# Patient Record
Sex: Male | Born: 1985 | Race: White | Hispanic: No | Marital: Single | Smoking: Never smoker
Health system: Southern US, Community
[De-identification: ages and names within clinical notes are randomized; demographics above are authoritative.]

## PROBLEM LIST (undated history)

## (undated) DIAGNOSIS — E782 Mixed hyperlipidemia: Secondary | ICD-10-CM

## (undated) DIAGNOSIS — L409 Psoriasis, unspecified: Secondary | ICD-10-CM

## (undated) DIAGNOSIS — M1A9XX0 Chronic gout, unspecified, without tophus (tophi): Secondary | ICD-10-CM

## (undated) DIAGNOSIS — A630 Anogenital (venereal) warts: Secondary | ICD-10-CM

## (undated) HISTORY — DX: Psoriasis, unspecified: L40.9

## (undated) HISTORY — DX: Mixed hyperlipidemia: E78.2

## (undated) HISTORY — DX: Morbid (severe) obesity due to excess calories: E66.01

## (undated) HISTORY — DX: Anogenital (venereal) warts: A63.0

## (undated) HISTORY — DX: Chronic gout, unspecified, without tophus (tophi): M1A.9XX0

---

## 2009-04-05 ENCOUNTER — Ambulatory Visit (HOSPITAL_COMMUNITY): Payer: Self-pay | Admitting: Psychiatry

## 2009-08-03 ENCOUNTER — Ambulatory Visit (HOSPITAL_COMMUNITY): Payer: Self-pay | Admitting: Psychiatry

## 2010-09-16 ENCOUNTER — Ambulatory Visit: Payer: Self-pay | Admitting: Emergency Medicine

## 2010-12-25 NOTE — Assessment & Plan Note (Signed)
Summary: L middle finger pain x 1 dy rm 4   Vital Signs:  Patient Profile:   25 Years Old Male CC:      L middle finger  pain x today Height:     69 inches Weight:      205 pounds O2 Sat:      100 % O2 treatment:    Room Air Temp:     98.0 degrees F oral Pulse rate:   77 / minute Pulse rhythm:   regular Resp:     16 per minute BP sitting:   116 / 78  (left arm) Cuff size:   regular  Vitals Entered By: Areta Haber CMA (September 16, 2010 3:37 PM)                  Current Allergies: No known allergies History of Present Illness Chief Complaint: L middle finger  pain x today History of Present Illness: Playing football yesterday, catching a football, jammed his L middle finger.  Has had pain and swelling since with stiffness.  Not using any OTC meds.  Hurts to move it.  Pain is fairly constant.  Current Problems: FINGER PAIN (ICD-729.5)   REVIEW OF SYSTEMS Constitutional Symptoms      Denies fever, chills, night sweats, weight loss, weight gain, and fatigue.  Eyes       Denies change in vision, eye pain, eye discharge, glasses, contact lenses, and eye surgery. Ear/Nose/Throat/Mouth       Denies hearing loss/aids, change in hearing, ear pain, ear discharge, dizziness, frequent runny nose, frequent nose bleeds, sinus problems, sore throat, hoarseness, and tooth pain or bleeding.  Respiratory       Denies dry cough, productive cough, wheezing, shortness of breath, asthma, bronchitis, and emphysema/COPD.  Cardiovascular       Denies murmurs, chest pain, and tires easily with exhertion.    Gastrointestinal       Denies stomach pain, nausea/vomiting, diarrhea, constipation, blood in bowel movements, and indigestion. Genitourniary       Denies painful urination, kidney stones, and loss of urinary control. Neurological       Denies paralysis, seizures, and fainting/blackouts. Musculoskeletal       Complains of muscle pain, decreased range of motion, redness, and  swelling.      Denies joint pain, joint stiffness, muscle weakness, and gout.      Comments: L middle finger x 1 dy Skin       Denies bruising, unusual mles/lumps or sores, and hair/skin or nail changes.  Psych       Denies mood changes, temper/anger issues, anxiety/stress, speech problems, depression, and sleep problems. Other Comments: Pt states he was playing football and jammed L middle finger yesterday 09/15/10.   Past History:  Past Medical History: Unremarkable  Past Surgical History: Denies surgical history  Family History: Family History Hypertension Family History Other cancer  Social History: Single Never Smoked Alcohol use-yes 1-2 wkly Drug use-no Regular exercise-yes Smoking Status:  never Drug Use:  no Does Patient Exercise:  yes Physical Exam General appearance: well developed, well nourished, no acute distress Head: normocephalic, atraumatic Skin: no obvious rashes or lesions MSE: oriented to time, place, and person L middle finger: No TTP at MCP or DIP, Pain is at PIP.  No collateral ligament laxity.  PIP ROM is slightly limited on flexion by swelling.  DIP and MCP ROM and strength is normal.  +swelling at PIP and throughtout most of finger, no bruising.  Normal  strength and sensation.  Intact radial pulse and normal cap refill. Assessment New Problems: FINGER PAIN (ICD-729.5)  Finger sprain Xray is normal  Plan New Orders: New Patient Level III [99203] T-DG Finger Middle*L* [73140] Planning Comments:   Ice, elevate, Buddy tape, Ibuprofen as needed Buddy tape for a few days After swelling subsides, start ROM exercises If still painful in a week, set up with sports medicine   The patient and/or caregiver has been counseled thoroughly with regard to medications prescribed including dosage, schedule, interactions, rationale for use, and possible side effects and they verbalize understanding.  Diagnoses and expected course of recovery discussed and  will return if not improved as expected or if the condition worsens. Patient and/or caregiver verbalized understanding.   Orders Added: 1)  New Patient Level III [99203] 2)  T-DG Finger Middle*L* [73140]

## 2011-02-19 ENCOUNTER — Inpatient Hospital Stay (INDEPENDENT_AMBULATORY_CARE_PROVIDER_SITE_OTHER)
Admission: RE | Admit: 2011-02-19 | Discharge: 2011-02-19 | Disposition: A | Discharge status: Home / Self Care | Payer: 59 | Source: Ambulatory Visit | Attending: Family Medicine | Admitting: Family Medicine

## 2011-02-19 ENCOUNTER — Encounter: Payer: Self-pay | Admitting: Family Medicine

## 2011-02-21 ENCOUNTER — Telehealth (INDEPENDENT_AMBULATORY_CARE_PROVIDER_SITE_OTHER): Payer: Self-pay | Admitting: *Deleted

## 2011-02-26 NOTE — Assessment & Plan Note (Signed)
Summary: LACERATION ON LEFT ELBOW/TJ procedure rm    Vital Signs:  Patient Profile:   25 Years Old Male CC:      LT elbow laceration Height:     69 inches O2 Sat:      97 % O2 treatment:    Room Air Temp:     98.2 degrees F oral Pulse rate:   88 / minute Resp:     16 per minute BP sitting:   117 / 78  (left arm) Cuff size:   regular  Vitals Entered By: Clemens Catholic LPN (February 19, 2011 6:53 PM)                  Updated Prior Medication List: No Medications Current Allergies: No known allergies History of Present Illness Chief Complaint: LT elbow laceration History of Present Illness:  Subjective:  Patient complains of falling today while playing basketball, landing on his left elbow resulting in laceration.  He finished the game without difficutly.  His tetanus immunization is current.  REVIEW OF SYSTEMS Constitutional Symptoms      Denies fever, chills, night sweats, weight loss, weight gain, and fatigue.  Eyes       Denies change in vision, eye pain, eye discharge, glasses, contact lenses, and eye surgery. Ear/Nose/Throat/Mouth       Denies hearing loss/aids, change in hearing, ear pain, ear discharge, dizziness, frequent runny nose, frequent nose bleeds, sinus problems, sore throat, hoarseness, and tooth pain or bleeding.  Respiratory       Denies dry cough, productive cough, wheezing, shortness of breath, asthma, bronchitis, and emphysema/COPD.  Cardiovascular       Denies murmurs, chest pain, and tires easily with exhertion.    Gastrointestinal       Denies stomach pain, nausea/vomiting, diarrhea, constipation, blood in bowel movements, and indigestion. Genitourniary       Denies painful urination, kidney stones, and loss of urinary control. Neurological       Denies paralysis, seizures, and fainting/blackouts. Musculoskeletal       Denies muscle pain, joint pain, joint stiffness, decreased range of motion, redness, swelling, muscle weakness, and gout.    Skin       Denies bruising, unusual mles/lumps or sores, and hair/skin or nail changes.  Psych       Denies mood changes, temper/anger issues, anxiety/stress, speech problems, depression, and sleep problems. Other Comments: pt states that he was playing basketball this afternoon and his LT elbow feel on the concrete, he has a laceration to the elbow. last Tdap 4 yrs ago.   Past History:  Past Medical History: Reviewed history from 09/16/2010 and no changes required. Unremarkable  Past Surgical History: Reviewed history from 09/16/2010 and no changes required. Denies surgical history  Family History: Family History Hypertension mom- hodgkins lymphoma  Social History: Reviewed history from 09/16/2010 and no changes required. Single Never Smoked Alcohol use-yes 1-2 wkly Drug use-no Regular exercise-yes   Objective:  Appearance:  Patient appears healthy, stated age, and in no acute distress  Left elbow:  No swelling or deformity.  There is a one cm long laceration over olecranon bursa.  No swelling.  Minimal surrounding tenderness.  Wound apprears clean without debris.  Elbow has full range of motion.  Distal neurovascular intact  Assessment New Problems: LACERATION (ICD-879.8) CONTUSION OF ELBOW (ICD-923.11)  LACERATION OVER OLECRANON BURSA; AT RISK FOR SEPTIC BURSITIS  Plan New Medications/Changes: CEPHALEXIN 500 MG TABS (CEPHALEXIN) One by mouth three times daily (every 8 hours)  #  30 x 0, 02/19/2011, Donna Christen MD  New Orders: Repair Superficial Wound(s) 2.5cm or <(scalp,neck,axillae,ext gent,trunk,extre) [12001] Ace Wraps 3-5 in/yard  [A6449] Est. Patient Level III [16109] Services provided After hours-Weekends-Holidays [99051] Planning Comments:   Advised to change bandage daily and apply Bacitracin ointment.  Keep wound clean and dry.   Empirically begin Keflex.  Apply ice pack several times daily until any swelling resolves.  May take Ibuprofen 200mg , 4 tabs  every 8 hours with food.  Wear ace wrap daily until all swelling resolves. Return for any signs of infection.  Suture removal 12 days.   The patient and/or caregiver has been counseled thoroughly with regard to medications prescribed including dosage, schedule, interactions, rationale for use, and possible side effects and they verbalize understanding.  Diagnoses and expected course of recovery discussed and will return if not improved as expected or if the condition worsens. Patient and/or caregiver verbalized understanding.   PROCEDURE:  Suture Site: Left elbow Size: One cm Number of Lacerations: One Anesthesia: Local 1% lidocaine with epinephrine Procedure: Procedure:  Laceration Repair Discussed benefits and risks of procedure and verbal consent obtained. Using sterile technique and local 1% lidocaine without epinephrine, cleansed wound with Betadine followed by copious lavage with normal saline.  Wound carefully inspected for debris and foreign bodies; none found.  Wound closed with #3 , 4-0 interrupted nylon sutures.  Bacitracin and non-stick sterile dressing applied.  Wound precautions explained to patient.   Compression dressing applied. Disposition: Home Prescriptions: CEPHALEXIN 500 MG TABS (CEPHALEXIN) One by mouth three times daily (every 8 hours)  #30 x 0   Entered and Authorized by:   Donna Christen MD   Signed by:   Donna Christen MD on 02/19/2011   Method used:   Print then Give to Patient   RxID:   [id number]   Orders Added: 1)  Repair Superficial Wound(s) 2.5cm or <(scalp,neck,axillae,ext gent,trunk,extre) [12001] 2)  Ace Wraps 3-5 in/yard  [A6449] 3)  Est. Patient Level III [95621] 4)  Services provided After hours-Weekends-Holidays [99051]

## 2011-02-26 NOTE — Progress Notes (Signed)
  Phone Note Outgoing Call Call back at Home Phone [phone] [facility]     Call placed by: Emilio Math,  February 21, 2011 11:31 AM Call placed to: Patient Summary of Call: Left msg to finish meds keep wound clean and dry. Return 12 days from initial injury, watch for signs of infection.

## 2011-03-04 ENCOUNTER — Encounter: Payer: Self-pay | Admitting: Emergency Medicine

## 2011-03-04 ENCOUNTER — Inpatient Hospital Stay (INDEPENDENT_AMBULATORY_CARE_PROVIDER_SITE_OTHER)
Admission: RE | Admit: 2011-03-04 | Discharge: 2011-03-04 | Disposition: A | Discharge status: Home / Self Care | Payer: Self-pay | Source: Ambulatory Visit | Attending: Emergency Medicine | Admitting: Emergency Medicine

## 2011-03-04 DIAGNOSIS — T148XXA Other injury of unspecified body region, initial encounter: Secondary | ICD-10-CM

## 2011-10-28 NOTE — Progress Notes (Signed)
Summary: REMOVE SUTURES/TJ   Vital Signs:  Patient Profile:   25 Years Old Male CC:      suture removal Height:     69 inches Temp:     98.3 degrees F oral Resp:     18 per minute  Vitals Entered By: Clemens Catholic LPN (March 03, 7845 6:07 PM)                  Updated Prior Medication List: No Medications Current Allergies (reviewed today): No known allergies History of Present Illness History from: patient Chief Complaint: suture removal History of Present Illness: Here for suture removal.  Reports no problems, no F/C, or other problems.    REVIEW OF SYSTEMS Constitutional Symptoms      Denies fever, chills, night sweats, weight loss, weight gain, and fatigue.  Eyes       Denies change in vision, eye pain, eye discharge, glasses, contact lenses, and eye surgery. Ear/Nose/Throat/Mouth       Denies hearing loss/aids, change in hearing, ear pain, ear discharge, dizziness, frequent runny nose, frequent nose bleeds, sinus problems, sore throat, hoarseness, and tooth pain or bleeding.  Respiratory       Denies dry cough, productive cough, wheezing, shortness of breath, asthma, bronchitis, and emphysema/COPD.  Cardiovascular       Denies murmurs, chest pain, and tires easily with exhertion.    Gastrointestinal       Denies stomach pain, nausea/vomiting, diarrhea, constipation, blood in bowel movements, and indigestion. Genitourniary       Denies painful urination, kidney stones, and loss of urinary control. Neurological       Denies paralysis, seizures, and fainting/blackouts. Musculoskeletal       Denies muscle pain, joint pain, joint stiffness, decreased range of motion, redness, swelling, muscle weakness, and gout.  Skin       Denies bruising, unusual mles/lumps or sores, and hair/skin or nail changes.  Psych       Denies mood changes, temper/anger issues, anxiety/stress, speech problems, depression, and sleep problems. Other Comments: The pt is here today for suture  removal.    Past History:  Past Medical History: Reviewed history from 09/16/2010 and no changes required. Unremarkable  Past Surgical History: Reviewed history from 09/16/2010 and no changes required. Denies surgical history   Family History: Reviewed history from 02/19/2011 and no changes required. Family History Hypertension mom- hodgkins lymphoma  Social History: Reviewed history from 09/16/2010 and no changes required. Single Never Smoked Alcohol use-yes 1-2 wkly Drug use-no Regular exercise-yes Physical Exam General appearance: well developed, well nourished, no acute distress MSE: oriented to time, place, and person L elbow with healed laceration with sutures intact, no signs of infection.  No tenderness.    Plan New Orders: No Charge Patient Arrived (NCPA0) [NCPA0] Planning Comments:   Sutures removed.  No bandage necessary.  Keep clean and dry and protected as needed.  Return if any signs of infection.   The patient and/or caregiver has been counseled thoroughly with regard to medications prescribed including dosage, schedule, interactions, rationale for use, and possible side effects and they verbalize understanding.  Diagnoses and expected course of recovery discussed and will return if not improved as expected or if the condition worsens. Patient and/or caregiver verbalized understanding.   PROCEDURE:  Suture Removal Site: L elbow Sutures removed without difficulty. Incision healing well. No redness. No edema. No drainage from incision. comments: wound looks good, well-healed Verbal Pain Scale: 0. ( 0=no pain, 5=worst pain  ever)  Orders Added: 1)  No Charge Patient Arrived (NCPA0) [NCPA0]

## 2011-12-07 ENCOUNTER — Emergency Department
Admission: EM | Admit: 2011-12-07 | Discharge: 2011-12-07 | Disposition: A | Discharge status: Home / Self Care | Payer: 59 | Source: Home / Self Care | Attending: Emergency Medicine | Admitting: Emergency Medicine

## 2011-12-07 ENCOUNTER — Emergency Department: Admit: 2011-12-07 | Discharge: 2011-12-07 | Disposition: A | Discharge status: Home / Self Care | Payer: 59

## 2011-12-07 DIAGNOSIS — M79609 Pain in unspecified limb: Secondary | ICD-10-CM

## 2011-12-07 DIAGNOSIS — M79646 Pain in unspecified finger(s): Secondary | ICD-10-CM

## 2011-12-07 NOTE — ED Provider Notes (Signed)
History     CSN: [id number]  Arrival date & time 12/07/11  1722   First MD Initiated Contact with Patient 12/07/11 1726      Chief Complaint  Patient presents with  . Finger Injury    (Consider location/radiation/quality/duration/timing/severity/associated sxs/prior treatment) HPI This patient comes in today complaining of right middle finger pain. It has been going on for 2 weeks.  He was playing football about 2 weeks ago and somebody ran into his finger and he thinks that he jammed the finger. Since then he has been using some ice, but he has not been resting it. In fact, he's actually been playing football and doing other full activities. He is right-handed. No previous right middle finger injuries in the past. He feels it is swollen, and it was bruised but the bruising has improved, and it is still painful.  History reviewed. No pertinent past medical history.  History reviewed. No pertinent past surgical history.  History reviewed. No pertinent family history.  History  Substance Use Topics  . Smoking status: Never Smoker   . Smokeless tobacco: Not on file  . Alcohol Use: Yes      Review of Systems  Allergies  Review of patient's allergies indicates no known allergies.  Home Medications  No current outpatient prescriptions on file.  BP 122/84  Pulse 93  Temp(Src) 97.7 F (36.5 C) (Oral)  Resp 18  Ht 5\' 9"  (1.753 m)  Wt 210 lb (95.255 kg)  BMI 31.01 kg/m2  SpO2 97%  Physical Exam  Nursing note and vitals reviewed. Constitutional: He is oriented to person, place, and time. He appears well-developed and well-nourished.  HENT:  Head: Normocephalic and atraumatic.  Eyes: No scleral icterus.  Neck: Neck supple.  Cardiovascular: Regular rhythm and normal heart sounds.   Pulmonary/Chest: Effort normal and breath sounds normal. No respiratory distress.  Musculoskeletal:       Right middle finger examination demonstrates full range of motion of the PIP, PIP,  and MCP. There is swelling throughout that middle finger, but no ecchymoses. He has tenderness at the PIP only. There appears to be no angulation or rotation. The rest of his hand and wrist examinations are completely normal. Distal neurovascular status is intact.  Neurological: He is alert and oriented to person, place, and time.  Skin: Skin is warm and dry.  Psychiatric: He has a normal mood and affect. His speech is normal.    ED Course  Procedures (including critical care time)  Labs Reviewed - No data to display Dg Finger Middle Right  12/07/2011  *RADIOLOGY REPORT*  Clinical Data: Stubbed the finger against firm object today.  RIGHT MIDDLE FINGER 2+V  Comparison: None.  Findings: Three views are performed, showing a fracture along the volar, radial aspect of the base of the middle phalanx of the third digit. There is minor step off at the articular surface.  There is associated mild soft tissue swelling.  No radiopaque foreign body identified.  l.  IMPRESSION: Fracture of the base of the middle phalanx of the third digit.  Original Report Authenticated By: Patterson Hammersmith, M.D.     1. Finger pain       MDM   X-rays are ordered and read by radiology as above. I placed him in a finger splint. Encouraged elevation, ice, Motrin, and rest.  I have given him the number and information for Dr. Pearletha Forge who does sports medicine and he will follow up with him.  As it has  already been 2 weeks, there is likely a little bit of healing. However the step off seen on the x-ray, it is possible that he may need some occupational therapy or orthopedic intervention in the future.  May also need repeat x-ray in 2 weeks to confirm healing. The patient and the mom are both here and understand.  Lily Kocher, MD 12/07/11 [phone]

## 2011-12-07 NOTE — ED Notes (Signed)
right middle finger injury x 2 weeks ago

## 2012-04-11 ENCOUNTER — Telehealth: Payer: Self-pay | Admitting: Emergency Medicine

## 2013-08-02 ENCOUNTER — Encounter: Payer: Self-pay | Admitting: *Deleted

## 2013-08-02 ENCOUNTER — Emergency Department
Admission: EM | Admit: 2013-08-02 | Discharge: 2013-08-02 | Disposition: A | Discharge status: Home / Self Care | Payer: 59 | Source: Home / Self Care | Attending: Family Medicine | Admitting: Family Medicine

## 2013-08-02 DIAGNOSIS — A630 Anogenital (venereal) warts: Secondary | ICD-10-CM

## 2013-08-02 MED ORDER — IMIQUIMOD 5 % EX CREA: TOPICAL_CREAM | CUTANEOUS | Status: DC

## 2013-08-02 NOTE — ED Notes (Signed)
Pt c/o rash in his groin area x 32yr. He has used Imiquimod cream for the past 3-4 wks with minimal relief.

## 2013-08-02 NOTE — ED Provider Notes (Signed)
CSN: [id number]     Arrival date & time 08/02/13  1320 History   First MD Initiated Contact with Patient 08/02/13 1338     Chief Complaint  Patient presents with  . Rash      HPI Comments: Patient complains of genital warts present for about a year.  He had been prescribed imiquimod cream, and warts seemed to be decreasing in size, but he believes the medication was damaged by storage in a hot environment.  He has not used the cream for several weeks.  Patient is a 27 y.o. male presenting with rash. The history is provided by the patient.  Rash Pain location: penis. Pain quality comment:  No pain Onset quality:  Gradual Timing:  Constant Progression:  Worsening Chronicity:  Recurrent Relieved by: imiquimod cream. Worsened by:  Nothing tried Ineffective treatments:  None tried Associated symptoms comment:  No swelling or pain   History reviewed. No pertinent past medical history. History reviewed. No pertinent past surgical history. Family History  Problem Relation Age of Onset  . Non-Hodgkin's lymphoma Mother    History  Substance Use Topics  . Smoking status: Never Smoker   . Smokeless tobacco: Not on file  . Alcohol Use: Yes    Review of Systems  Skin: Positive for rash.  All other systems reviewed and are negative.    Allergies  Review of patient's allergies indicates no known allergies.  Home Medications   Current Outpatient Rx  Name  Route  Sig  Dispense  Refill  . imiquimod (ALDARA) 5 % cream   Topical   Apply topically 3 (three) times a week. Apply at bedtime.   24 each   1    BP 135/86  Pulse 74  Temp(Src) 98.3 F (36.8 C) (Oral)  Resp 18  Wt 181 lb (82.101 kg)  BMI 26.72 kg/m2  SpO2 98% Physical Exam  Nursing note and vitals reviewed. Constitutional: He is oriented to person, place, and time. He appears well-developed and well-nourished. No distress.  HENT:  Head: Normocephalic.  Eyes: Conjunctivae are normal. Pupils are equal, round, and  reactive to light.  Genitourinary:    No penile tenderness.  Shaft of penis has multiple soft verrucous papules, 2 to 4mm dia, extending to pubic area.  No swelling, erythema, or tenderness.  Neurological: He is alert and oriented to person, place, and time.  Skin: Skin is warm and dry.    ED Course  Procedures         MDM   1. Condyloma acuminata    Begin Imiquimod topical, 3 times weekly for 16 weeks. Followup with Family Doctor if not improved 16 weeks    Lattie Haw, MD 08/02/13 [phone]

## 2013-08-26 ENCOUNTER — Emergency Department (INDEPENDENT_AMBULATORY_CARE_PROVIDER_SITE_OTHER): Payer: 59

## 2013-08-26 ENCOUNTER — Encounter: Payer: Self-pay | Admitting: Emergency Medicine

## 2013-08-26 ENCOUNTER — Emergency Department
Admission: EM | Admit: 2013-08-26 | Discharge: 2013-08-26 | Disposition: A | Discharge status: Home / Self Care | Payer: 59 | Source: Home / Self Care | Attending: Emergency Medicine | Admitting: Emergency Medicine

## 2013-08-26 DIAGNOSIS — S91309A Unspecified open wound, unspecified foot, initial encounter: Secondary | ICD-10-CM

## 2013-08-26 DIAGNOSIS — S91341A Puncture wound with foreign body, right foot, initial encounter: Secondary | ICD-10-CM

## 2013-08-26 DIAGNOSIS — L089 Local infection of the skin and subcutaneous tissue, unspecified: Secondary | ICD-10-CM

## 2013-08-26 DIAGNOSIS — Z23 Encounter for immunization: Secondary | ICD-10-CM

## 2013-08-26 DIAGNOSIS — W268XXA Contact with other sharp object(s), not elsewhere classified, initial encounter: Secondary | ICD-10-CM

## 2013-08-26 DIAGNOSIS — IMO0002 Reserved for concepts with insufficient information to code with codable children: Secondary | ICD-10-CM

## 2013-08-26 MED ORDER — TETANUS-DIPHTH-ACELL PERTUSSIS 5-2.5-18.5 LF-MCG/0.5 IM SUSP
0.5000 mL | Freq: Once | INTRAMUSCULAR | Status: AC
  Administered 2013-08-26: 0.5 mL via INTRAMUSCULAR

## 2013-08-26 MED ORDER — DOXYCYCLINE HYCLATE 100 MG PO CAPS: 100.0000 mg | ORAL_CAPSULE | Freq: Two times a day (BID) | ORAL | Status: DC

## 2013-08-26 MED ORDER — HYDROCODONE-ACETAMINOPHEN 5-325 MG PO TABS: 1.0000 | ORAL_TABLET | Freq: Four times a day (QID) | ORAL | Status: DC | PRN
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
## 2013-10-18 ENCOUNTER — Other Ambulatory Visit: Payer: Self-pay | Admitting: Family Medicine

## 2015-05-31 ENCOUNTER — Emergency Department (INDEPENDENT_AMBULATORY_CARE_PROVIDER_SITE_OTHER)
Admission: EM | Admit: 2015-05-31 | Discharge: 2015-05-31 | Disposition: A | Discharge status: Home / Self Care | Payer: Commercial Managed Care - HMO | Source: Home / Self Care | Attending: Family Medicine | Admitting: Family Medicine

## 2015-05-31 ENCOUNTER — Encounter: Payer: Self-pay | Admitting: Emergency Medicine

## 2015-05-31 ENCOUNTER — Emergency Department (INDEPENDENT_AMBULATORY_CARE_PROVIDER_SITE_OTHER): Payer: Commercial Managed Care - HMO

## 2015-05-31 DIAGNOSIS — J029 Acute pharyngitis, unspecified: Secondary | ICD-10-CM

## 2015-05-31 DIAGNOSIS — R05 Cough: Secondary | ICD-10-CM

## 2015-05-31 DIAGNOSIS — J209 Acute bronchitis, unspecified: Secondary | ICD-10-CM | POA: Diagnosis not present

## 2015-05-31 LAB — POCT RAPID STREP A (OFFICE): Rapid Strep A Screen: NEGATIVE

## 2015-05-31 MED ORDER — PREDNISONE 20 MG PO TABS: ORAL_TABLET | ORAL | Status: DC

## 2015-05-31 MED ORDER — AZITHROMYCIN 250 MG PO TABS: 250.0000 mg | ORAL_TABLET | Freq: Every day | ORAL | Status: DC

## 2015-05-31 NOTE — Discharge Instructions (Signed)
You may take 400-600mg  Ibuprofen (Motrin) every 6-8 hours for fever and pain  Alternate with Tylenol  You may take  Tylenol every 4-6 hours as needed for fever and pain  Follow-up with your primary care provider next week for recheck of symptoms if not improving.  Be sure to drink plenty of fluids and rest, at least 8hrs of sleep a night, preferably more while you are sick. Return to Urgent Care or go to the closest ER if you cannot keep down fluids/signs of dehydration, fever not reducing with Tylenol, difficulty breathing/wheezing, stiff neck, worsening condition, or other concerns (see below)

## 2015-05-31 NOTE — ED Notes (Signed)
Pt c/o cough with mucous, sore throat, congestion and HA x 1 month.

## 2015-05-31 NOTE — ED Provider Notes (Signed)
CSN: [id number]     Arrival date & time 05/31/15  1658 History   First MD Initiated Contact with Patient 05/31/15 1700     Chief Complaint  Patient presents with  . Cough  . Sore Throat   (Consider location/radiation/quality/duration/timing/severity/associated sxs/prior Treatment) HPI Patient is a 29 year old male presenting to the urgent care with complaint of intermittent productive cough with white and green mucus associated sore throat nasal congestion and intermittent generalized headache for last 3-4 weeks. Patient states symptoms significantly worsened over the last 6 days, resulting in poor sleep due to congestion and cough. He has tried DayQuil and NyQuil and naproxen at home with minimal relief. States he tries not to take these daily as they do have side effects as indicated on the box. Denies sick contacts or recent travel. No rashes or tick bites. No history of asthma or COPD. Denies difficulty breathing or swallowing. Denies chest pain abdominal pain or back pain at this time.  History reviewed. No pertinent past medical history. History reviewed. No pertinent past surgical history. Family History  Problem Relation Age of Onset  . Non-Hodgkin's lymphoma Mother    History  Substance Use Topics  . Smoking status: Never Smoker   . Smokeless tobacco: Not on file  . Alcohol Use: Yes    Review of Systems  Constitutional: Positive for fever ( subjective) and chills. Negative for diaphoresis, appetite change and fatigue.  HENT: Positive for congestion and sore throat. Negative for ear pain, sinus pressure, trouble swallowing and voice change.   Respiratory: Positive for cough. Negative for shortness of breath and stridor.   Cardiovascular: Negative for chest pain.  Gastrointestinal: Positive for nausea. Negative for vomiting, abdominal pain, diarrhea and constipation.  Musculoskeletal: Negative for myalgias, back pain, arthralgias, neck pain and neck stiffness.  Skin: Negative  for rash and wound.  Neurological: Positive for headaches. Negative for dizziness, syncope, weakness, light-headedness and numbness.  All other systems reviewed and are negative.   Allergies  Review of patient's allergies indicates not on file.  Home Medications   Prior to Admission medications   Medication Sig Start Date End Date Taking? Authorizing Provider  azithromycin (ZITHROMAX) 250 MG tablet Take 1 tablet (250 mg total) by mouth daily. Take first 2 tablets together, then 1 every day until finished. 05/31/15   Junius Finner, PA-C  doxycycline (VIBRAMYCIN) 100 MG capsule Take 1 capsule (100 mg total) by mouth 2 (two) times daily. Take x10 days. This is an antibiotic 08/26/13   Lajean Manes, MD  HYDROcodone-acetaminophen (NORCO/VICODIN) 5-325 MG per tablet Take 1-2 tablets by mouth every 6 (six) hours as needed for pain. Take with food.-May cause drowsiness 08/26/13   Lajean Manes, MD  imiquimod Mathis Dad) 5 % cream Apply topically 3 (three) times a week. Apply at bedtime. 08/02/13   Lattie Haw, MD  predniSONE (DELTASONE) 20 MG tablet 3 tabs po day one, then 2 po daily x 4 days 05/31/15   Junius Finner, PA-C   BP 128/87 mmHg  Pulse 81  Temp(Src) 98.3 F (36.8 C) (Oral)  SpO2 97% Physical Exam  Constitutional: He appears well-developed and well-nourished.  Pt sitting comfortably in exam bed, NAD.     HENT:  Head: Normocephalic and atraumatic.  Right Ear: Hearing, tympanic membrane, external ear and ear canal normal.  Left Ear: Hearing, tympanic membrane, external ear and ear canal normal.  Nose: Right sinus exhibits no maxillary sinus tenderness and no frontal sinus tenderness. Left sinus exhibits no maxillary sinus tenderness  and no frontal sinus tenderness.  Mouth/Throat: Uvula is midline and mucous membranes are normal. Posterior oropharyngeal edema and posterior oropharyngeal erythema present. No oropharyngeal exudate or tonsillar abscesses.  Eyes: Conjunctivae are normal. No  scleral icterus.  Neck: Normal range of motion. Neck supple.  Cardiovascular: Normal rate, regular rhythm and normal heart sounds.   Pulmonary/Chest: Effort normal and breath sounds normal. No respiratory distress. He has no wheezes. He has no rales. He exhibits no tenderness.  No respiratory distress on exam. Able to speak in full sentences. Lungs: CTAB with slight decreased lung sounds in lower lung fields. No wheeze or rhonchi.  Abdominal: Soft. Bowel sounds are normal. He exhibits no distension and no mass. There is no tenderness. There is no rebound and no guarding.  Musculoskeletal: Normal range of motion.  Lymphadenopathy:    He has no cervical adenopathy.  Neurological: He is alert.  Skin: Skin is warm and dry.  Nursing note and vitals reviewed.   ED Course  Procedures (including critical care time) Labs Review Labs Reviewed  POCT RAPID STREP A (OFFICE)    Imaging Review Dg Chest 2 View  05/31/2015   CLINICAL DATA:  Chest discomfort and cough for 1 month.  EXAM: CHEST  2 VIEW  COMPARISON:  None.  FINDINGS: Heart size and mediastinal contours are within normal limits. Both lungs are clear. Visualized skeletal structures are unremarkable.  IMPRESSION: Negative exam.   Electronically Signed   By: Drusilla Kannerhomas  Dalessio M.D.   On: 05/31/2015 17:39     MDM   1. Acute bronchitis, unspecified organism   2. Acute pharyngitis, unspecified pharyngitis type     Patient is a 29 year old male presenting to urgent care with intermittent productive cough sore throat congestion for 1 month. Patient appears well nontoxic is afebrile. O2 sat 97% on room air. No respiratory distress on exam. Lungs clear with slight decreased sounds in lower lung fields bilaterally. Patient does have bilateral tonsillar erythema and moderate edema without exudates. Due to duration of symptoms will get rapid strep as well as chest x-ray to rule out strep and/or pneumonia.  Chest x-ray is negative for evidence of  pneumonia or other acute abnormality. Rapid strep negative. Although chest x-ray and rapid strep negative. Treat patient for acute bronchitis with prednisone taper for 5 days as well as azithromycin due to chronicity of productive cough. Advised pt to use acetaminophen and ibuprofen as needed for fever and pain. Encouraged rest and fluids. Return precautions provided. Pt verbalized understanding and agreement with tx plan.    Junius Finnerrin O'Malley, PA-C 05/31/15 1751

## 2015-06-02 ENCOUNTER — Encounter: Payer: Self-pay | Admitting: Emergency Medicine

## 2015-06-02 ENCOUNTER — Emergency Department (INDEPENDENT_AMBULATORY_CARE_PROVIDER_SITE_OTHER)
Admission: EM | Admit: 2015-06-02 | Discharge: 2015-06-02 | Disposition: A | Discharge status: Home / Self Care | Payer: Commercial Managed Care - HMO | Source: Home / Self Care | Attending: Emergency Medicine | Admitting: Emergency Medicine

## 2015-06-02 DIAGNOSIS — N342 Other urethritis: Secondary | ICD-10-CM

## 2015-06-02 DIAGNOSIS — N481 Balanitis: Secondary | ICD-10-CM

## 2015-06-02 DIAGNOSIS — N4889 Other specified disorders of penis: Secondary | ICD-10-CM | POA: Diagnosis not present

## 2015-06-02 DIAGNOSIS — N489 Disorder of penis, unspecified: Secondary | ICD-10-CM

## 2015-06-02 MED ORDER — FLUCONAZOLE 150 MG PO TABS: ORAL_TABLET | ORAL | Status: DC

## 2015-06-02 MED ORDER — IMIQUIMOD 5 % EX CREA: TOPICAL_CREAM | CUTANEOUS | Status: DC

## 2015-06-02 MED ORDER — DOXYCYCLINE HYCLATE 100 MG PO CAPS: 100.0000 mg | ORAL_CAPSULE | Freq: Two times a day (BID) | ORAL | Status: DC

## 2015-06-02 NOTE — ED Notes (Signed)
Pt c/o penile pain x4 days. States he has had unprotected intercourse and he is also having some d/c. States the pain started about 4 days ago.

## 2015-06-02 NOTE — ED Provider Notes (Signed)
CSN: [id number]     Arrival date & time 06/02/15  1943 History   First MD Initiated Contact with Patient 06/02/15 2008  8:08 PM [facility] Friday evening   Chief Complaint  Patient presents with  . Penis Pain   HPI 4 days ago onset of painful red penile lesion along the wide area of mid shaft of penis after unprotected intercourse with her steady girlfriend. Monogamous. Also has been [place] and sweating a lot without showering and he feels this partially could be a fungal or bacterial infection from that. He has a previous diagnosis of penile warts and he requests a refill of Aldara cream, further warts at the base of his penis, but he feels the painful lesions on the shaft of penis aren't different from his penile warts. Also complains of clearish penile discharge the past couple of days.. Denies fever or chills or abdominal pain or nausea or vomiting or chest pain or shortness of breath. Denies weight loss or other rash or adenopathy or ENT lesions. History reviewed. No pertinent past medical history. History reviewed. No pertinent past surgical history. Family History  Problem Relation Age of Onset  . Non-Hodgkin's lymphoma Mother    History  Substance Use Topics  . Smoking status: Never Smoker   . Smokeless tobacco: Not on file  . Alcohol Use: Yes    Review of Systems  All other systems reviewed and are negative.   Allergies  Review of patient's allergies indicates not on file.  Home Medications   Prior to Admission medications   Medication Sig Start Date End Date Taking? Authorizing Provider  azithromycin (ZITHROMAX) 250 MG tablet Take 1 tablet (250 mg total) by mouth daily. Take first 2 tablets together, then 1 every day until finished. 05/31/15   Junius Finner, PA-C  doxycycline (VIBRAMYCIN) 100 MG capsule Take 1 capsule (100 mg total) by mouth 2 (two) times daily. 06/02/15   Lajean Manes, MD  fluconazole (DIFLUCAN) 150 MG tablet Take 2 by mouth now,  then (starting tomorrow), 1 daily for a total of 10 days of treatment of fungal infection 06/02/15   Lajean Manes, MD  imiquimod Mathis Dad) 5 % cream Apply topically to warts 3 times a week as directed 06/02/15   Lajean Manes, MD  imiquimod Mathis Dad) 5 % cream Apply topically 3 (three) times a week. For warts 06/02/15   Lajean Manes, MD  predniSONE (DELTASONE) 20 MG tablet 3 tabs po day one, then 2 po daily x 4 days 05/31/15   Junius Finner, PA-C   BP 148/96 mmHg  Pulse 96  Temp(Src) 97.6 F (36.4 C) (Oral)  SpO2 97% Physical Exam  Constitutional: He is oriented to person, place, and time. He appears well-developed and well-nourished. No distress.  HENT:  Head: Normocephalic and atraumatic.  Eyes: Conjunctivae and EOM are normal. Pupils are equal, round, and reactive to light. No scleral icterus.  Neck: Normal range of motion.  Cardiovascular: Normal rate.   Pulmonary/Chest: Effort normal.  Abdominal: He exhibits no distension and no mass. There is no tenderness.  Genitourinary:    No discharge found.  Musculoskeletal: Normal range of motion.  Lymphadenopathy:       Right: No inguinal adenopathy present.       Left: No inguinal adenopathy present.  Neurological: He is alert and oriented to person, place, and time.  Skin: Skin is warm.  Psychiatric: He has a normal mood and affect.  Nursing note and vitals reviewed.  ED Course  Procedures (including critical care time) Labs Review Labs Reviewed  HERPES SIMPLEX VIRUS CULTURE  GC/CHLAMYDIA PROBE AMP, URINE     MDM   1. Urethritis   2. Balanitis   3. Penile lesion    He likely has several diagnoses. The mid shaft lesions are likely fungal balanitis, could be mixed bacterial cellulitis, but in the differential could be herpes lesion. With his consent, I swabbed the lesion mid shaft and sent for herpes culture. Treated with doxycycline for antibacterial coverage and oral Diflucan for 10 days. Urine sent off for GC and chlamydia  testing. He declined any blood test for HIV or RPR, but I advised he have those tests done at his PCP. He has prior known diagnosis of HPV warts, previously treated successfully with Aldara cream, so at his request I refilled Aldara cream with instructions, but I advised follow-up with his PCP or dermatologist for this. Other advice given and safe sex practices given. Advised to keep the area clean and dry. Follow-up with your primary care doctor or dermatologist in 5-7 days if not improving, or sooner if symptoms become worse. Precautions discussed. Over 25 minutes spent, greater than 50% of the time spent for counseling and coordination of care. Red flags discussed. Questions invited and answered. Patient voiced understanding and agreement.     Lajean Manesavid Massey, MD 06/02/15 2038

## 2015-06-06 LAB — GC/CHLAMYDIA PROBE AMP, URINE
Chlamydia, Swab/Urine, PCR: NEGATIVE
GC Probe Amp, Urine: NEGATIVE

## 2015-06-07 LAB — HERPES SIMPLEX VIRUS CULTURE: Organism ID, Bacteria: DETECTED

## 2015-06-10 ENCOUNTER — Telehealth: Payer: Self-pay | Admitting: Emergency Medicine

## 2015-07-07 ENCOUNTER — Encounter: Payer: Self-pay | Admitting: *Deleted

## 2015-07-07 ENCOUNTER — Emergency Department (INDEPENDENT_AMBULATORY_CARE_PROVIDER_SITE_OTHER): Payer: Commercial Managed Care - HMO

## 2015-07-07 ENCOUNTER — Emergency Department
Admission: EM | Admit: 2015-07-07 | Discharge: 2015-07-07 | Disposition: A | Discharge status: Home / Self Care | Payer: Commercial Managed Care - HMO | Source: Home / Self Care | Attending: Family Medicine | Admitting: Family Medicine

## 2015-07-07 DIAGNOSIS — M79672 Pain in left foot: Secondary | ICD-10-CM

## 2015-07-07 MED ORDER — MELOXICAM 7.5 MG PO TABS: 7.5000 mg | ORAL_TABLET | Freq: Every day | ORAL | Status: DC

## 2015-07-07 NOTE — ED Notes (Signed)
Pt c/o left lateral foot pain without injury intermittent x 12 month.

## 2015-07-07 NOTE — ED Provider Notes (Signed)
CSN: [id number]     Arrival date & time 07/07/15  1902 History   First MD Initiated Contact with Patient 07/07/15 1920     Chief Complaint  Patient presents with  . Foot Pain   (Consider location/radiation/quality/duration/timing/severity/associated sxs/prior Treatment) HPI The pt is a 29yo male presenting to [facility] with c/o intermittent Left foot pain. Pain is aching and sore, 3/10 at this time, waxes and wanes for 3-4 weeks. Worse with ambulation and palpation. States the pain has been coming and going 1-2 days at a time and will resolve on its own or with OTC pain medication but states today pain has been persistent. 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 Denies hx of gout. Denies calf pain or swelling. Denies prior hx of surgery to Left foot. Pt is not a runner.    History reviewed. No pertinent past medical history. History reviewed. No pertinent past surgical history. Family History  Problem Relation Age of Onset  . Non-Hodgkin's lymphoma Mother    Social History  Substance Use Topics  . Smoking status: Never Smoker   . Smokeless tobacco: None  . Alcohol Use: Yes    Review of Systems  Constitutional: Negative for fever and chills.  Musculoskeletal: Positive for myalgias, joint swelling and arthralgias.       Left lateral foot  Skin: Negative for color change, rash and wound.  Neurological: Negative for weakness and numbness.    Allergies  Review of patient's allergies indicates no known allergies.  Home Medications   Prior to Admission medications   Medication Sig Start Date End Date Taking? Authorizing Provider  imiquimod Mathis Dad) 5 % cream Apply topically to warts 3 times a week as directed 06/02/15   Lajean Manes, MD  meloxicam (MOBIC) 7.5 MG tablet Take 1 tablet (7.5 mg total) by mouth daily. 07/07/15   Junius Finner, PA-C   BP 121/88 mmHg  Pulse 106  Resp 16  Wt 200 lb (90.719 kg)  SpO2 98% Physical Exam  Constitutional: He is oriented to  person, place, and time. He appears well-developed and well-nourished.  HENT:  Head: Normocephalic and atraumatic.  Eyes: EOM are normal.  Neck: Normal range of motion.  Cardiovascular: Normal rate.   Pulses:      Dorsalis pedis pulses are 2+ on the left side.  Left foot: cap refill < 3 seconds  Pulmonary/Chest: Effort normal.  Musculoskeletal: Normal range of motion. He exhibits edema and tenderness.       Left foot: There is tenderness.       Feet:  Left foot: mild edema with tenderness over dorsal lateral aspect. No tenderness to medial aspect of foot. FROM Left ankle and toe w/o pain.  No tenderness to ankle or toes. Calf is soft, non-tender  Neurological: He is alert and oriented to person, place, and time.  Skin: Skin is warm and dry. There is erythema.  Left foot: mild erythema over dorsal lateral aspect. No induration or fluctuance. No bleeding or discharge.  Psychiatric: He has a normal mood and affect. His behavior is normal.  Nursing note and vitals reviewed.   ED Course  Procedures (including critical care time) Labs Review Labs Reviewed - No data to display  Imaging Review Dg Foot Complete Left  07/07/2015   CLINICAL DATA:  Lateral left foot pain  EXAM: LEFT FOOT - COMPLETE 3+ VIEW  COMPARISON:  None.  FINDINGS: Three views of the left foot submitted. No acute fracture or subluxation. No radiopaque  foreign body.  IMPRESSION: Negative.   Electronically Signed   By: Natasha Mead M.D.   On: 07/07/2015 20:00     MDM   1. Left foot pain     Pt c/o Left foot pain, no known injury, however pt does have mild edema with tenderness over dorsum of Left foot. Foot is neurovascularly in tact. Not concerning for underlying infection such as cellulitis or abscess. Area is atypical for gout. Doubt DVT.  Plain films: negative for acute fracture or subluxation  F/u with Dr. Denyse Amass, Sports Medicine in 1-2 weeks if not improving. ACE wrap provided in UC Discussed RICE home tx. Rx:  meloxicam.        Junius Finner, PA-C 07/07/15 2008

## 2015-07-07 NOTE — Discharge Instructions (Signed)
°  Meloxicam (Mobic) is an antiinflammatory to help with pain and inflammation.  Do not take ibuprofen, Advil, Aleve, or any other medications that contain NSAIDs while taking meloxicam as this may cause stomach upset or even ulcers if taken in large amounts for an extended period of time.  ° °

## 2016-01-18 DIAGNOSIS — A630 Anogenital (venereal) warts: Secondary | ICD-10-CM

## 2016-01-18 HISTORY — DX: Anogenital (venereal) warts: A63.0

## 2016-12-16 ENCOUNTER — Emergency Department (INDEPENDENT_AMBULATORY_CARE_PROVIDER_SITE_OTHER)
Admission: EM | Admit: 2016-12-16 | Discharge: 2016-12-16 | Disposition: A | Discharge status: Home / Self Care | Payer: 59 | Source: Home / Self Care | Attending: Family Medicine | Admitting: Family Medicine

## 2016-12-16 ENCOUNTER — Emergency Department (INDEPENDENT_AMBULATORY_CARE_PROVIDER_SITE_OTHER): Payer: 59

## 2016-12-16 DIAGNOSIS — M25472 Effusion, left ankle: Secondary | ICD-10-CM

## 2016-12-16 DIAGNOSIS — M19072 Primary osteoarthritis, left ankle and foot: Secondary | ICD-10-CM

## 2016-12-16 DIAGNOSIS — M25572 Pain in left ankle and joints of left foot: Secondary | ICD-10-CM

## 2016-12-16 DIAGNOSIS — M779 Enthesopathy, unspecified: Secondary | ICD-10-CM

## 2016-12-16 MED ORDER — PREDNISONE 20 MG PO TABS: ORAL_TABLET | ORAL | 0 refills | Status: DC

## 2016-12-16 NOTE — ED Provider Notes (Signed)
CSN: [id number]     Arrival date & time 12/16/16  1844 History   First MD Initiated Contact with Patient 12/16/16 1902     Chief Complaint  Patient presents with  . Ankle Pain    left   (Consider location/radiation/quality/duration/timing/severity/associated sxs/prior Treatment) HPI Calvin Rubio is a 31 y.o. male presenting to UC with c/o Left ankle pain, swelling, redness and warmth that started yesterday. Pain is aching and sore, 9/10 at rest but 7/10 with his cowboy boots on as they provide some pressure and support to his ankle.  No known injury. Hx of recurrent symptoms that typically last 1-2 days but occasionally last up to 1 week.  Moderate to significant relief with Aleve.  Pt believes he may have gout however, he has not been formally dx with it.   He has not f/u with an orthopedist or sports medicine provide ras symptoms typically resolve quickly. No known injury.    History reviewed. No pertinent past medical history. History reviewed. No pertinent surgical history. Family History  Problem Relation Age of Onset  . Non-Hodgkin's lymphoma Mother    Social History  Substance Use Topics  . Smoking status: Never Smoker  . Smokeless tobacco: Not on file  . Alcohol use Yes    Review of Systems  Musculoskeletal: Positive for arthralgias, joint swelling and myalgias.       Left ankle pain and swelling  Skin: Positive for color change. Negative for wound.  Neurological: Negative for weakness and numbness.    Allergies  Patient has no known allergies.  Home Medications   Prior to Admission medications   Medication Sig Start Date End Date Taking? Authorizing Provider  imiquimod Mathis Dad) 5 % cream Apply topically to warts 3 times a week as directed 06/02/15   Lajean Manes, MD  meloxicam (MOBIC) 7.5 MG tablet Take 1 tablet (7.5 mg total) by mouth daily. 07/07/15   Junius Finner, PA-C  predniSONE (DELTASONE) 20 MG tablet 3 tabs po day one, then 2 po daily x 4 days 12/16/16   Junius Finner, PA-C   Meds Ordered and Administered this Visit  Medications - No data to display  BP 132/91 (BP Location: Left Arm)   Pulse 115   Temp 98.5 F (36.9 C) (Oral)   Ht 5\' 8"  (1.727 m)   Wt 262 lb (118.8 kg)   SpO2 96%   BMI 39.84 kg/m  No data found.   Physical Exam  Constitutional: He is oriented to person, place, and time. He appears well-developed and well-nourished.  HENT:  Head: Normocephalic and atraumatic.  Eyes: EOM are normal.  Neck: Normal range of motion.  Cardiovascular: Normal rate.   Pulses:      Dorsalis pedis pulses are 2+ on the left side.       Posterior tibial pulses are 2+ on the left side.  Pulmonary/Chest: Effort normal.  Musculoskeletal: Normal range of motion. He exhibits edema and tenderness.  Left ankle: mild to moderate edema to medial aspect. Full ROM ankle. Tender to light touch.  Calf is soft, non-tender.  Neurological: He is alert and oriented to person, place, and time.  Skin: Skin is warm and dry. Capillary refill takes less than 2 seconds. There is erythema.  Left ankle: skin in tact. Erythema, mild warmth to medial aspect.   Psychiatric: He has a normal mood and affect. His behavior is normal.  Nursing note and vitals reviewed.   Urgent Care Course     Procedures (including  critical care time)  Labs Review Labs Reviewed - No data to display  Imaging Review Dg Ankle Complete Left  Result Date: 12/16/2016 CLINICAL DATA:  31 year old male with left ankle pain and swelling for 2 days. Symptoms off and on for the past year. No known injury. Initial encounter. EXAM: LEFT ANKLE COMPLETE - 3+ VIEW COMPARISON:  No comparison ankle films. Comparison left foot films 07/07/2015. FINDINGS: No fracture or dislocation. Mild degenerative changes medium malleolar articulation with the medial aspect of the talar dome with small spur. There may be an ankle joint effusion. Soft tissue prominence greater medially.  Etiology indeterminate.  IMPRESSION: Mild degenerative changes medium malleolar articulation with the medial aspect of the talar dome with small spur. There may be an ankle joint effusion. Soft tissue prominence greater medially.  Etiology indeterminate. No fracture or dislocation. Electronically Signed   By: Lacy DuverneySteven  Olson M.D.   On: 12/16/2016 19:32      MDM   1. Acute left ankle pain   2. Left ankle swelling   3. Osteoarthritis of left ankle, unspecified osteoarthritis type    Pt c/o intermittent Left ankle pain, swelling and redness. Hx and exam today c/w gout.    Plain films: mild degenerative changes to medial aspect, small joint effusion.  Pt does have ankle splints at home he can use. Declined ASO splint in UC. Pt notes Aleve provides moderate to significant relief. Rx: Prednisone Encouraged f/u with Sports Medicine or Orthopedist for further evaluation and treatment of recurrent ankle and foot pain/swelling. Patient verbalized understanding and agreement with treatment plan.    Junius Finnerrin O'Malley, PA-C 12/16/16 1950

## 2016-12-16 NOTE — ED Triage Notes (Signed)
Having left foot/ankle pain.  Has been happening off and on for past year, and in both feet.  Left ankle has swelling around and below the ankle.

## 2017-01-19 IMAGING — CR DG FOOT COMPLETE 3+V*L*
3 series · 3 of 3 positions shown · non-contrast
Comparison: None.

CLINICAL DATA: Lateral left foot pain

EXAM:
LEFT FOOT - COMPLETE 3+ VIEW

[foot ap]
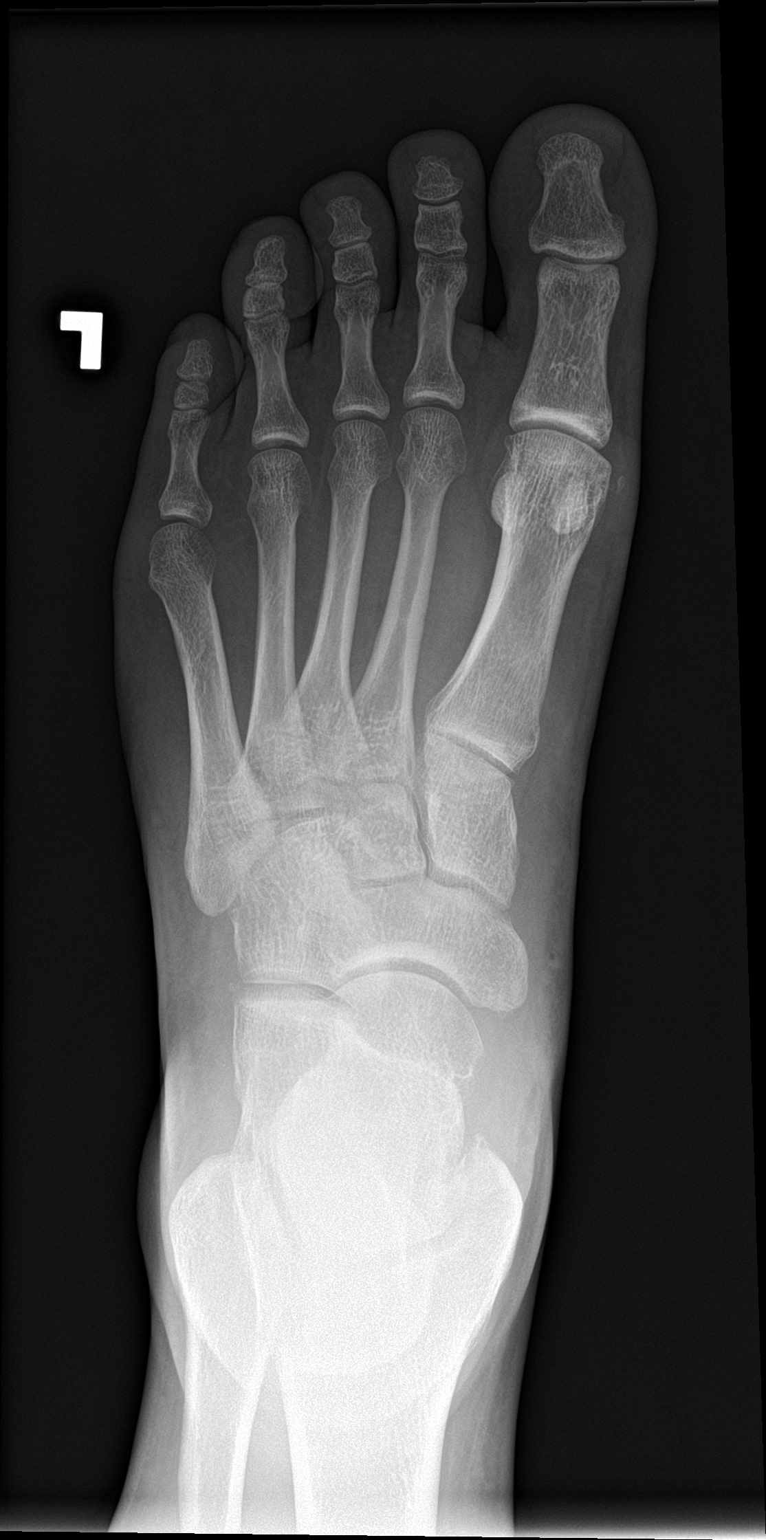

[foot obl]
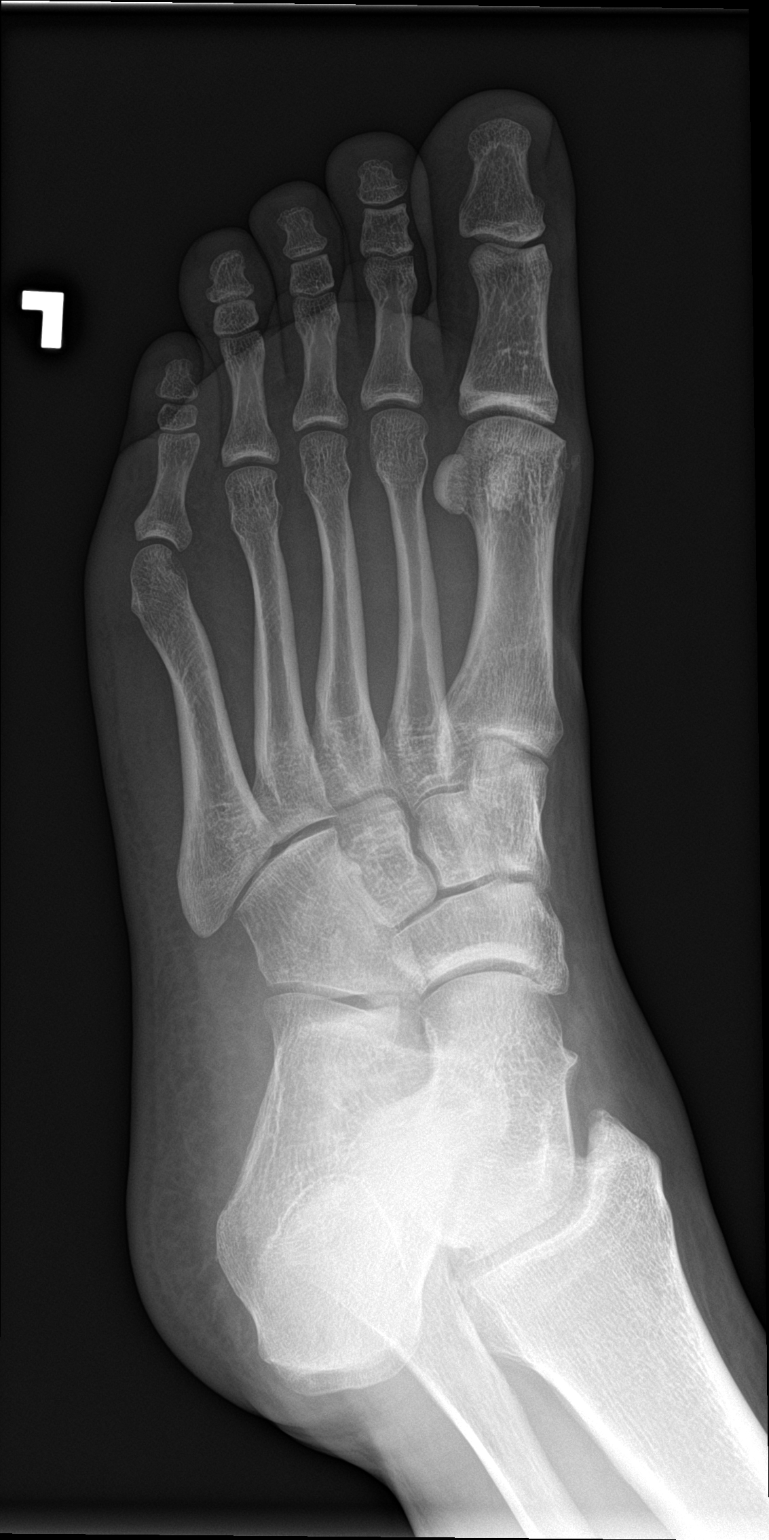

[foot lat]
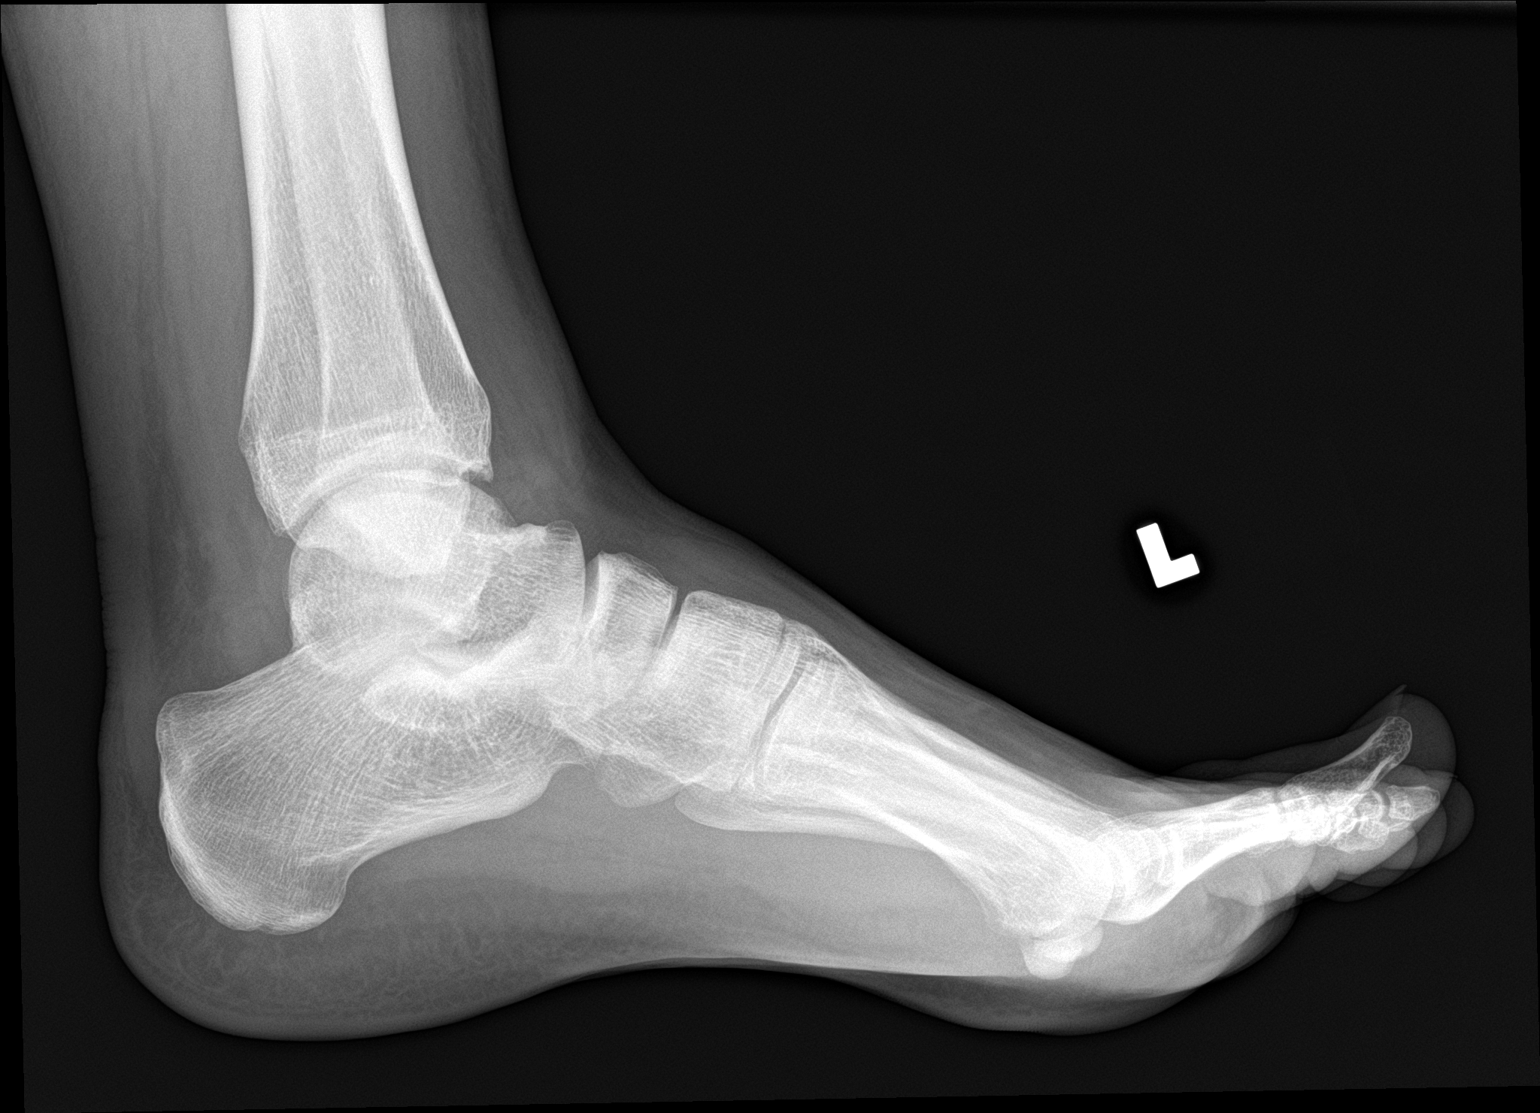

[3 of 3 positions shown; findings below may reference images not displayed]

FINDINGS: Three views of the left foot submitted. No acute fracture or
subluxation. No radiopaque foreign body.
IMPRESSION: Negative.

## 2017-02-24 ENCOUNTER — Ambulatory Visit (INDEPENDENT_AMBULATORY_CARE_PROVIDER_SITE_OTHER): Payer: 59 | Admitting: Family Medicine

## 2017-02-24 ENCOUNTER — Encounter: Payer: Self-pay | Admitting: Family Medicine

## 2017-02-24 DIAGNOSIS — M25572 Pain in left ankle and joints of left foot: Secondary | ICD-10-CM

## 2017-02-24 DIAGNOSIS — B353 Tinea pedis: Secondary | ICD-10-CM

## 2017-02-24 DIAGNOSIS — M25571 Pain in right ankle and joints of right foot: Secondary | ICD-10-CM

## 2017-02-24 DIAGNOSIS — M25579 Pain in unspecified ankle and joints of unspecified foot: Secondary | ICD-10-CM | POA: Insufficient documentation

## 2017-02-24 MED ORDER — TERBINAFINE HCL 250 MG PO TABS: 250.0000 mg | ORAL_TABLET | Freq: Every day | ORAL | 1 refills | Status: DC

## 2017-02-24 MED ORDER — ALLOPURINOL 100 MG PO TABS: 100.0000 mg | ORAL_TABLET | Freq: Every day | ORAL | 0 refills | Status: DC

## 2017-02-24 MED ORDER — COLCHICINE 0.6 MG PO TABS: 0.6000 mg | ORAL_TABLET | Freq: Every day | ORAL | 2 refills | Status: DC

## 2017-02-24 NOTE — Progress Notes (Signed)
   Subjective:    I'm seeing this patient as a consultation for:  Donna Christen, MD  CC: right ankle pain  HPI: Calvin Rubio is a 31 yo man presenting today with episodic right ankle pain and rash on both feet.  His ankle is not currently bothering him today.  He has had similar episodes to this in both ankles, knees and big toes. He reports his joint will become painful, swollen and red.  This happens more when he drinks or eats red meat.  When it happens, he take CPD oil and aleve.  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  He has a family history of gout.  The patient reports a rash on both feet for the last month.  He has tried OTC cortisol cream but it has not helped.  He has had athlete's foot in the past but states it has never been this bad before.       Past medical history, Surgical history, Family history not pertinant except as noted below, Social history, Allergies, and medications have been entered into the medical record, reviewed, and no changes needed.   Review of Systems: No headache, visual changes, nausea, vomiting, diarrhea, constipation, dizziness, abdominal pain, skin rash, fevers, chills, night sweats, weight loss, swollen lymph nodes, body aches, joint swelling, muscle aches, chest pain, shortness of breath, mood changes, visual or auditory hallucinations.   Objective:    Vitals:   02/24/17 1527  BP: (!) 136/91  Pulse: 99   General: Well Developed, well nourished, and in no acute distress.  Neuro/Psych: Alert and oriented x3, extra-ocular muscles intact, able to move all 4 extremities, sensation grossly intact. Skin: Scaly rash on right dorsal foot and between toes bilaterally   Respiratory: Not using accessory muscles, speaking in full sentences, trachea midline.  Cardiovascular: Pulses palpable, no extremity edema. Abdomen: Does not appear distended. MSK:  Right Ankle- appears normal.  No  tenderness to palpation.  Full ROM and strength.   No results found for this or any previous visit (from the past 24 hour(s)). No results found.  Impression and Recommendations:    Assessment and Plan: 31 y.o. male with episodic joint pain and swelling after alcohol consumption and foot rash.  Joint Pain:  Gout likely.  Blood uric acid levels will be done.  Patient was instructed on following a low purine diet and weight loss for flares up.  Colchicine given to prevent flares and allopurinol to lower his uric acid.    Rash:  Suspected tinea incognito secondary to tropical steroid use on exisiting fungal infection.  Terbinafine prescribed for likely fungal infection and foot hygiene encouraged.   Recheck in 1 month.   Discussed warning signs or symptoms. Please see discharge instructions. Patient expresses understanding.

## 2017-02-24 NOTE — Patient Instructions (Signed)
Thank you for coming in today. 1) GOUT: Take colchicine daily for 3 months.  Take allopurinol daily.  Eat a gout  (low purine) diet.  Get labs today.  Recheck in 1 month.   2) Foot: Take terbinafine daily.  Apply topical Lamisil daily.  STOP hydrocortisone cream.    RECHECK in 1 month.   Low-Purine Diet Purines are compounds that affect the level of uric acid in your body. A low-purine diet is a diet that is low in purines. Eating a low-purine diet can prevent the level of uric acid in your body from getting too high and causing gout or kidney stones or both. What do I need to know about this diet?  Choose low-purine foods. Examples of low-purine foods are listed in the next section.  Drink plenty of fluids, especially water. Fluids can help remove uric acid from your body. Try to drink 8-16 cups (1.9-3.8 L) a day.  Limit foods high in fat, especially saturated fat, as fat makes it harder for the body to get rid of uric acid. Foods high in saturated fat include pizza, cheese, ice cream, whole milk, fried foods, and gravies. Choose foods that are lower in fat and lean sources of protein. Use olive oil when cooking as it contains healthy fats that are not high in saturated fat.  Limit alcohol. Alcohol interferes with the elimination of uric acid from your body. If you are having a gout attack, avoid all alcohol.  Keep in mind that different people's bodies react differently to different foods. You will probably learn over time which foods do or do not affect you. If you discover that a food tends to cause your gout to flare up, avoid eating that food. You can more freely enjoy foods that do not cause problems. If you have any questions about a food item, talk to your dietitian or health care provider. Which foods are low, moderate, and high in purines? The following is a list of foods that are low, moderate, and high in purines. You can eat any amount of the foods that are low in purines.  You may be able to have small amounts of foods that are moderate in purines. Ask your health care provider how much of a food moderate in purines you can have. Avoid foods high in purines. Grains   Foods low in purines: Enriched white bread, pasta, rice, cake, cornbread, popcorn.  Foods moderate in purines: Whole-grain breads and cereals, wheat germ, bran, oatmeal. Uncooked oatmeal. Dry wheat bran or wheat germ.  Foods high in purines: Pancakes, [place] toast, biscuits, muffins. Vegetables   Foods low in purines: All vegetables, except those that are moderate in purines.  Foods moderate in purines: Asparagus, cauliflower, spinach, mushrooms, green peas. Fruits   All fruits are low in purines. Meats and other Protein Foods   Foods low in purines: Eggs, nuts, peanut butter.  Foods moderate in purines: 80-90% lean beef, lamb, veal, pork, poultry, fish, eggs, peanut butter, nuts. Crab, lobster, oysters, and shrimp. Cooked dried beans, peas, and lentils.  Foods high in purines: Anchovies, sardines, herring, mussels, tuna, codfish, scallops, trout, and haddock. Calvin Rubio. Organ meats (such as liver or kidney). Tripe. Game meat. Goose. Sweetbreads. Dairy   All dairy foods are low in purines. Low-fat and fat-free dairy products are best because they are low in saturated fat. Beverages   Drinks low in purines: Water, carbonated beverages, tea, coffee, cocoa.  Drinks moderate in purines: Soft drinks and other drinks sweetened  with high-fructose corn syrup. Juices. To find whether a food or drink is sweetened with high-fructose corn syrup, look at the ingredients list.  Drinks high in purines: Alcoholic beverages (such as beer). Condiments   Foods low in purines: Salt, herbs, olives, pickles, relishes, vinegar.  Foods moderate in purines: Butter, margarine, oils, mayonnaise. Fats and Oils   Foods low in purines: All types, except gravies and sauces made with meat.  Foods high in purines:  Gravies and sauces made with meat. Other Foods   Foods low in purines: Sugars, sweets, gelatin. Cake. Soups made without meat.  Foods moderate in purines: Meat-based or fish-based soups, broths, or bouillons. Foods and drinks sweetened with high-fructose corn syrup.  Foods high in purines: High-fat desserts (such as ice cream, cookies, cakes, pies, doughnuts, and chocolate). Contact your dietitian for more information on foods that are not listed here.  This information is not intended to replace advice given to you by your health care provider. Make sure you discuss any questions you have with your health care provider. Document Released: 03/08/2011 Document Revised: 04/18/2016 Document Reviewed: 10/18/2013 Elsevier Interactive Patient Education  2017 [employer].

## 2017-02-25 LAB — CBC
HCT: 43.3 % (ref 38.5–50.0)
Hemoglobin: 15 g/dL (ref 13.2–17.1)
MCH: 30.5 pg (ref 27.0–33.0)
MCHC: 34.6 g/dL (ref 32.0–36.0)
MCV: 88 fL (ref 80.0–100.0)
MPV: 12.1 fL (ref 7.5–12.5)
Platelets: 266 10*3/uL (ref 140–400)
RBC: 4.92 MIL/uL (ref 4.20–5.80)
RDW: 13.2 % (ref 11.0–15.0)
WBC: 8.8 10*3/uL (ref 3.8–10.8)

## 2017-02-25 LAB — COMPLETE METABOLIC PANEL WITH GFR
ALT: 45 U/L (ref 9–46)
AST: 30 U/L (ref 10–40)
Albumin: 4.5 g/dL (ref 3.6–5.1)
Alkaline Phosphatase: 83 U/L (ref 40–115)
BUN: 20 mg/dL (ref 7–25)
CO2: 26 mmol/L (ref 20–31)
Calcium: 10 mg/dL (ref 8.6–10.3)
Chloride: 102 mmol/L (ref 98–110)
Creat: 1.18 mg/dL (ref 0.60–1.35)
GFR, Est African American: >89 mL/min (ref >=60)
GFR, Est Non African American: 83 mL/min (ref >=60)
GLUCOSE: 100 mg/dL — AB (ref 65–99)
POTASSIUM: 4 mmol/L (ref 3.5–5.3)
SODIUM: 138 mmol/L (ref 135–146)
Total Bilirubin: 0.3 mg/dL (ref 0.2–1.2)
Total Protein: 7.3 g/dL (ref 6.1–8.1)

## 2017-02-25 LAB — URIC ACID: Uric Acid, Serum: 9.6 mg/dL — ABNORMAL HIGH (ref 4.0–8.0)

## 2017-03-26 ENCOUNTER — Encounter: Payer: Self-pay | Admitting: Family Medicine

## 2017-03-26 ENCOUNTER — Ambulatory Visit (INDEPENDENT_AMBULATORY_CARE_PROVIDER_SITE_OTHER): Payer: 59 | Admitting: Family Medicine

## 2017-03-26 DIAGNOSIS — M1A9XX0 Chronic gout, unspecified, without tophus (tophi): Secondary | ICD-10-CM | POA: Insufficient documentation

## 2017-03-26 DIAGNOSIS — L409 Psoriasis, unspecified: Secondary | ICD-10-CM | POA: Diagnosis not present

## 2017-03-26 DIAGNOSIS — M1A0710 Idiopathic chronic gout, right ankle and foot, without tophus (tophi): Secondary | ICD-10-CM

## 2017-03-26 HISTORY — DX: Chronic gout, unspecified, without tophus (tophi): M1A.9XX0

## 2017-03-26 HISTORY — DX: Psoriasis, unspecified: L40.9

## 2017-03-26 MED ORDER — CLOBETASOL PROPIONATE 0.05 % EX OINT: 1.0000 "application " | TOPICAL_OINTMENT | Freq: Two times a day (BID) | CUTANEOUS | 3 refills | Status: DC

## 2017-03-26 MED ORDER — TRIAMCINOLONE ACETONIDE 0.5 % EX OINT: 1.0000 "application " | TOPICAL_OINTMENT | Freq: Two times a day (BID) | CUTANEOUS | 6 refills | Status: DC

## 2017-03-26 NOTE — Patient Instructions (Addendum)
Thank you for coming in today. Use triamcinolone ointment for baseline control of what I suspect is psirosis.  Use clobetasol for the hot spots.   For gout get uric acid checked in a few weeks.  Continue colchicine.   Recheck in 3 months or sooner if needed.    Psoriasis Psoriasis is a long-term (chronic) condition of skin inflammation. It occurs because your immune system causes skin cells to form too quickly. As a result, too many skin cells grow and create raised, red patches (plaques) that look silvery on your skin. Plaques may appear anywhere on your body. They can be any size or shape. Psoriasis can come and go. The condition varies from mild to very severe. It cannot be passed from one person to another (not contagious). What are the causes? The cause of psoriasis is not known, but certain factors can make the condition worse. These include:  Damage or trauma to the skin, such as cuts, scrapes, sunburn, and dryness.  Lack of sunlight.  Certain medicines.  Alcohol.  Tobacco use.  Stress.  Infections caused by bacteria or viruses. What increases the risk? This condition is more likely to develop in:  People with a family history of psoriasis.  People who are Caucasian.  People who are between the ages of 15-4 and 15-44 years old. What are the signs or symptoms? There are five different types of psoriasis. You can have more than one type of psoriasis during your life. Types are:  Plaque.  Guttate.  Inverse.  Pustular.  Erythrodermic. Each type of psoriasis has different symptoms.  Plaque psoriasis symptoms include red, raised plaques with a silvery white coating (scale). These plaques may be itchy. Your nails may be pitted and crumbly or fall off.  Guttate psoriasis symptoms include small red spots that often show up on your trunk, arms, and legs. These spots may develop after you have been sick, especially with strep throat.  Inverse psoriasis symptoms  include plaques in your underarm area, under your breasts, or on your genitals, groin, or buttocks.  Pustular psoriasis symptoms include pus-filled bumps that are painful, red, and swollen on the palms of your hands or the soles of your feet. You also may feel exhausted, feverish, weak, or have no appetite.  Erythrodermic psoriasis symptoms include bright red skin that may look burned. You may have a fast heartbeat and a body temperature that is too high or too low. You may be itchy or in pain. How is this diagnosed? Your health care provider may suspect psoriasis based on your symptoms and family history. Your health care provider will also do a physical exam. This may include a procedure to remove a tissue sample (biopsy) for testing. You may also be referred to a health care provider who specializes in skin diseases (dermatologist). How is this treated? There is no cure for this condition, but treatment can help manage it. Goals of treatment include:  Helping your skin heal.  Reducing itching and inflammation.  Slowing the growth of new skin cells.  Helping your immune system respond better to your skin. Treatment varies, depending on the severity of your condition. Treatment may include:  Creams or ointments.  Ultraviolet ray exposure (light therapy). This may include natural sunlight or light therapy in a medical office.  Medicines (systemic therapy). These medicines can help your body better manage skin cell turnover and inflammation. They may be used along with light therapy or ointments. You may also get antibiotic medicines if you  have an infection. Follow these instructions at home: Skin Care   Moisturize your skin as needed. Only use moisturizers that have been approved by your health care provider.  Apply cool compresses to the affected areas.  Do not scratch your skin. Lifestyle    Do not use tobacco products. This includes cigarettes, chewing tobacco, and  e-cigarettes. If you need help quitting, ask your health care provider.  Drink little or no alcohol.  Try techniques for stress reduction, such as meditation or yoga.  Get exposure to the sun as told by your health care provider. Do not get sunburned.  Consider joining a psoriasis support group. Medicines   Take or use over-the-counter and prescription medicines only as told by your health care provider.  If you were prescribed an antibiotic, take or use it as told by your health care provider. Do not stop taking the antibiotic even if your condition starts to improve. General instructions   Keep a journal to help track what triggers an outbreak. Try to avoid any triggers.  See a counselor or social worker if feelings of sadness, frustration, and hopelessness about your condition are interfering with your work and relationships.  Keep all follow-up visits as told by your health care provider. This is important. Contact a health care provider if:  Your pain gets worse.  You have increasing redness or warmth in the affected areas.  You have new or worsening pain or stiffness in your joints.  Your nails start to break easily or pull away from the nail bed.  You have a fever.  You feel depressed. This information is not intended to replace advice given to you by your health care provider. Make sure you discuss any questions you have with your health care provider. Document Released: 11/08/2000 Document Revised: 04/18/2016 Document Reviewed: 03/29/2015 Elsevier Interactive Patient Education  2017 [employer].

## 2017-03-26 NOTE — Progress Notes (Signed)
BHARGAV BARBARO is a 31 y.o. male who presents to [facility]: [facility] today for follow-up gout and foot rash.  Patient was seen about a month ago for joint pain thought to be due to gout. He's had considerable improvement with colchicine. Additionally he was started on allopurinol when his uric acid levels came back elevated. He notes that he has not had a gout flare. He's feeling great.  He also was found to have a rash on his feet bilaterally. This is thought of the time to be tinea incognito due to steroid use. I advised him to discontinue steroid use and gave him oral terbinafine as well as well as topical terbinafine. He notes his rashes gotten much worse. He's developed plaques of erythematous areas across his feet with some scale. He notes they're occasionally itchy.   No past medical history on file. No past surgical history on file. Social History  Substance Use Topics  . Smoking status: Never Smoker  . Smokeless tobacco: Current User  . Alcohol use Yes   family history includes Non-Hodgkin's lymphoma in his mother.  ROS as above:  Medications: Current Outpatient Prescriptions  Medication Sig Dispense Refill  . allopurinol (ZYLOPRIM) 100 MG tablet Take 1 tablet (100 mg total) by mouth daily. 90 tablet 0  . colchicine 0.6 MG tablet Take 1 tablet (0.6 mg total) by mouth daily. 30 tablet 2  . imiquimod (ALDARA) 5 % cream Apply topically to warts 3 times a week as directed 12 each 0  . meloxicam (MOBIC) 7.5 MG tablet Take 1 tablet (7.5 mg total) by mouth daily. 30 tablet 0  . clobetasol ointment (TEMOVATE) 0.05 % Apply 1 application topically 2 (two) times daily. For hot spots 60 g 3  . triamcinolone ointment (KENALOG) 0.5 % Apply 1 application topically 2 (two) times daily. To affected area, avoid eyes and face 60 g 6   No current facility-administered medications for  this visit.    No Known Allergies  Health Maintenance Health Maintenance  Topic Date Due  . HIV Screening  09/04/2003  . INFLUENZA VACCINE  06/25/2017  . TETANUS/TDAP  08/27/2023     Exam:  BP (!) 136/92   Pulse (!) 119   Wt 255 lb (115.7 kg)   BMI 38.77 kg/m  Gen: Well NAD HEENT: EOMI,  MMM Lungs: Normal work of breathing. CTABL Heart: RRR no MRG Heart rate 88 bpm per my check today. Abd: NABS, Soft. Nondistended, Nontender Exts: Brisk capillary refill, warm and well perfused.  Skin: Plaques of erythematous circular areas a few centimeters across with scale on feet bilaterally.        Lab Results  Component Value Date   LABURIC 9.6 (H) 02/24/2017      Assessment and Plan: 31 y.o. male with  Gout much better symptomatically. Plan to continue allopurinol and prophylactic colchicine. Plan to check uric acid levels in a few weeks if still not at goal we'll increase allopurinol to 300 mg daily. Plan for colchicine prophylaxis for about 3 months potentially 6 months  Foot rash: I think based on the appearance of the rash today and the lack of response to oral and topical terbinafine is likely that he has psoriasis. I've prescribed topical clobetasol and triamcinolone ointment.  Recheck in about a month. PCP will take over dermatologic issues.   Orders Placed This Encounter  Procedures  . Uric acid  . COMPLETE METABOLIC PANEL WITH GFR  .  CBC   Meds ordered this encounter  Medications  . clobetasol ointment (TEMOVATE) 0.05 %    Sig: Apply 1 application topically 2 (two) times daily. For hot spots    Dispense:  60 g    Refill:  3  . triamcinolone ointment (KENALOG) 0.5 %    Sig: Apply 1 application topically 2 (two) times daily. To affected area, avoid eyes and face    Dispense:  60 g    Refill:  6     Discussed warning signs or symptoms. Please see discharge instructions. Patient expresses understanding.  CC: Deloris Ping, MD

## 2017-03-27 NOTE — Progress Notes (Signed)
Progress notes faxed through epic to PCP

## 2017-04-18 ENCOUNTER — Other Ambulatory Visit: Payer: Self-pay | Admitting: Family Medicine

## 2017-05-22 ENCOUNTER — Other Ambulatory Visit: Payer: Self-pay | Admitting: Family Medicine

## 2017-07-16 ENCOUNTER — Other Ambulatory Visit: Payer: Self-pay | Admitting: Family Medicine

## 2017-08-26 ENCOUNTER — Other Ambulatory Visit: Payer: Self-pay | Admitting: Family Medicine

## 2017-10-14 ENCOUNTER — Other Ambulatory Visit: Payer: Self-pay

## 2017-10-14 MED ORDER — COLCHICINE 0.6 MG PO TABS: 0.6000 mg | ORAL_TABLET | Freq: Every day | ORAL | 0 refills | Status: DC

## 2017-10-19 ENCOUNTER — Other Ambulatory Visit: Payer: Self-pay | Admitting: Family Medicine

## 2017-10-28 ENCOUNTER — Ambulatory Visit (INDEPENDENT_AMBULATORY_CARE_PROVIDER_SITE_OTHER): Payer: 59 | Admitting: Family Medicine

## 2017-10-28 ENCOUNTER — Encounter: Payer: Self-pay | Admitting: Family Medicine

## 2017-10-28 VITALS — BP 137/92 | HR 91 | Wt 241.0 lb

## 2017-10-28 DIAGNOSIS — M1A0710 Idiopathic chronic gout, right ankle and foot, without tophus (tophi): Secondary | ICD-10-CM | POA: Diagnosis not present

## 2017-10-28 MED ORDER — FEBUXOSTAT 80 MG PO TABS: 80.0000 mg | ORAL_TABLET | Freq: Every day | ORAL | 3 refills | Status: DC

## 2017-10-28 MED ORDER — IMIQUIMOD 5 % EX CREA: TOPICAL_CREAM | CUTANEOUS | 0 refills | Status: DC

## 2017-10-28 MED ORDER — COLCHICINE 0.6 MG PO TABS: 0.6000 mg | ORAL_TABLET | Freq: Every day | ORAL | 2 refills | Status: DC

## 2017-10-28 NOTE — Progress Notes (Signed)
       Calvin Rubio is a 31 y.o. male who [facility] today for gout.   Patient had been previously well-managed on daily allopurinol and colchicine daily. He ran out of colchicine on Friday and had a gout flare in his right ankle just 2 days after. Gout flare was associated with weekend of drinking. He continues to take allopurinol otherwise. Patient has not had blood work recently but had uric acid of 9.6 last April.  Otherwise, patient is doing well without any acute complaints.    No past medical history on file. No past surgical history on file. Social History   Tobacco Use  . Smoking status: Never Smoker  . Smokeless tobacco: Current User  Substance Use Topics  . Alcohol use: Yes   family history includes Non-Hodgkin's lymphoma in his mother.  ROS as above:  Medications: Current Outpatient Medications  Medication Sig Dispense Refill  . colchicine 0.6 MG tablet Take 1 tablet (0.6 mg total) by mouth daily. 90 tablet 2  . imiquimod (ALDARA) 5 % cream Apply topically to warts 3 times a week as directed 12 each 0  . Febuxostat (ULORIC) 80 MG TABS Take 1 tablet (80 mg total) by mouth daily. 90 tablet 3   No current facility-administered medications for this visit.    No Known Allergies  Health Maintenance Health Maintenance  Topic Date Due  . HIV Screening  09/04/2003  . TETANUS/TDAP  08/27/2023  . INFLUENZA VACCINE  Completed     Exam:  BP (!) 137/92   Pulse 91   Wt 241 lb (109.3 kg)   BMI 36.64 kg/m  Gen: Well NAD HEENT: EOMI,  MMM Lungs: Normal work of breathing. CTABL Heart: RRR no MRG Abd: NABS, Soft. Nondistended, Nontender Exts: Brisk capillary refill, warm and well perfused. Nontender over distal right achilles, where gout flare occurred    Assessment and Plan: 31 y.o. male with gout. Patient was on allopurinol and  colchicine together for 6 months. Ran out of colchicine and had flare within days, which is surprising.  Instead of tapering up allopurinol, we will switch directly to Uloric 80. He should take Uloric daily and colchicine for the next 2 months. After that point, he can take Uloric only and colchicine when symptomatic.   He should have labs done today.  Follow-up as needed.   Orders Placed This Encounter  Procedures  . CBC  . COMPLETE METABOLIC PANEL WITH GFR  . Uric acid   Meds ordered this encounter  Medications  . imiquimod (ALDARA) 5 % cream    Sig: Apply topically to warts 3 times a week as directed    Dispense:  12 each    Refill:  0  . colchicine 0.6 MG tablet    Sig: Take 1 tablet (0.6 mg total) by mouth daily.    Dispense:  90 tablet    Refill:  2  . Febuxostat (ULORIC) 80 MG TABS    Sig: Take 1 tablet (80 mg total) by mouth daily.    Dispense:  90 tablet    Refill:  3     Discussed warning signs or symptoms. Please see discharge instructions. Patient expresses understanding.

## 2017-10-28 NOTE — Patient Instructions (Signed)
Thank you for coming in today.  Take Uloric daily.  Continue colchicine daily for 2 months then just take as needed for a few days if you have a gout flair.   Get labs today.    STOP Allopurinol.    Febuxostat oral tablets What is this medicine? FEBUXOSTAT (feb UX oh stat) is used to treat gout. People with gout have too much uric acid in their body. This medicine works to lower how much uric acid the body makes. This medicine may be used for other purposes; ask your health care provider or pharmacist if you have questions. COMMON BRAND NAME(S): Uloric What should I tell my health care provider before I take this medicine? They need to know if you have any of these conditions: -heart disease -history of stroke -kidney disease -liver disease - taking theophylline, azathioprine, or mercaptopurine - an unusual or allergic reaction to febuxostat, other medicines, foods, dyes, or preservatives - pregnant or trying to get pregnant - breast feeding How should I use this medicine? Take this medicine by mouth with a glass of water. Follow the directions on the prescription label. You can take it with or without food. Take your medicine at regular intervals. Do not take it more often than directed. Do not stop taking except on your doctor's advice. Talk to your pediatrician regarding the use of this medicine in children. This medicine is not approved for use in children. Overdosage: If you think you have taken too much of this medicine contact a poison control center or emergency room at once. NOTE: This medicine is only for you. Do not share this medicine with others. What if I miss a dose? If you miss a dose, take it as soon as you can. If it is almost time for your next dose, take only that dose. Do not take double or extra doses. What may interact with this medicine? Do not take this medicine with any of the following medications: -azathioprine -mercaptopurine -theophylline This  medicine may also interact with the following medications: -certain medicines used to treat gout -medicines used to treat cancer -rasburicase This list may not describe all possible interactions. Give your health care provider a list of all the medicines, herbs, non-prescription drugs, or dietary supplements you use. Also tell them if you smoke, drink alcohol, or use illegal drugs. Some items may interact with your medicine. What should I watch for while using this medicine? Visit your doctor or health care professional for regular checks on your progress. You will need regular blood tests while you are taking this medicine. Your gout may flare up while you are taking this medicine. If you have gout pain, do not stop taking this medicine. You may need to take another medicine with this medicine for gout pain. What side effects may I notice from receiving this medicine? Side effects that you should report to your doctor or health care professional as soon as possible: -allergic reactions like skin rash, itching or hives, swelling of the face, lips, or tongue -breathing problems -changes in vision -chest pain -confusion, trouble speaking or understanding -dizziness -fast, irregular heartbeat -feeling faint or lightheaded, falls -gout pain -muscle aches or pains -severe headaches -sudden numbness or weakness of the face, arm or leg -trouble walking, dizziness, loss of balance or coordination -unusually weak or tired -yellowing of the eyes or skin Side effects that usually do not require medical attention (report to your doctor or health care professional if they continue or are bothersome): -changes  in appetite -constipation or diarrhea -nausea -red, hot flush to face or skin -stomach upset or pain This list may not describe all possible side effects. Call your doctor for medical advice about side effects. You may report side effects to FDA at 1-800-FDA-1088. Where should I keep my  medicine? Keep out of the reach of children. Store at room temperature between 15 and 30 degrees C (59 and 86 degrees F). Protect from light and moisture. Throw away any unused medicine after the expiration date. NOTE: This sheet is a summary. It may not cover all possible information. If you have questions about this medicine, talk to your doctor, pharmacist, or health care provider.  2018 Elsevier/Gold Standard (2015-12-13 16:32:58)

## 2017-10-29 LAB — COMPLETE METABOLIC PANEL WITH GFR
AG Ratio: 1.9 (calc) (ref 1.0–2.5)
ALT: 103 U/L — ABNORMAL HIGH (ref 9–46)
AST: 64 U/L — AB (ref 10–40)
Albumin: 5.2 g/dL — ABNORMAL HIGH (ref 3.6–5.1)
Alkaline phosphatase (APISO): 77 U/L (ref 40–115)
BUN: 9 mg/dL (ref 7–25)
CALCIUM: 10.1 mg/dL (ref 8.6–10.3)
CO2: 29 mmol/L (ref 20–32)
CREATININE: 0.85 mg/dL (ref 0.60–1.35)
Chloride: 96 mmol/L — ABNORMAL LOW (ref 98–110)
GFR, EST NON AFRICAN AMERICAN: 118 mL/min/{1.73_m2} (ref >=60)
GFR, Est African American: 136 mL/min/{1.73_m2} (ref >=60)
Globulin: 2.8 g/dL (calc) (ref 1.9–3.7)
Glucose, Bld: 87 mg/dL (ref 65–99)
Potassium: 3.9 mmol/L (ref 3.5–5.3)
Sodium: 136 mmol/L (ref 135–146)
Total Bilirubin: 0.7 mg/dL (ref 0.2–1.2)
Total Protein: 8 g/dL (ref 6.1–8.1)

## 2017-10-29 LAB — CBC
HEMATOCRIT: 48.8 % (ref 38.5–50.0)
HEMOGLOBIN: 17 g/dL (ref 13.2–17.1)
MCH: 30.5 pg (ref 27.0–33.0)
MCHC: 34.8 g/dL (ref 32.0–36.0)
MCV: 87.5 fL (ref 80.0–100.0)
MPV: 12.4 fL (ref 7.5–12.5)
Platelets: 236 10*3/uL (ref 140–400)
RBC: 5.58 10*6/uL (ref 4.20–5.80)
RDW: 12 % (ref 11.0–15.0)
WBC: 9.2 10*3/uL (ref 3.8–10.8)

## 2017-10-29 LAB — URIC ACID: Uric Acid, Serum: 7.5 mg/dL (ref 4.0–8.0)

## 2017-12-07 ENCOUNTER — Other Ambulatory Visit: Payer: Self-pay | Admitting: Family Medicine

## 2018-07-01 IMAGING — DX DG ANKLE COMPLETE 3+V*L*
3 series · 3 of 3 positions shown · non-contrast
Comparison: No comparison ankle films. Comparison left foot films
07/07/2015.

CLINICAL DATA: 30-year-old male with left ankle pain and swelling
for 2 days. Symptoms off and on for the past year. No known injury.
Initial encounter.

EXAM:
LEFT ANKLE COMPLETE - 3+ VIEW

[ankle ap]
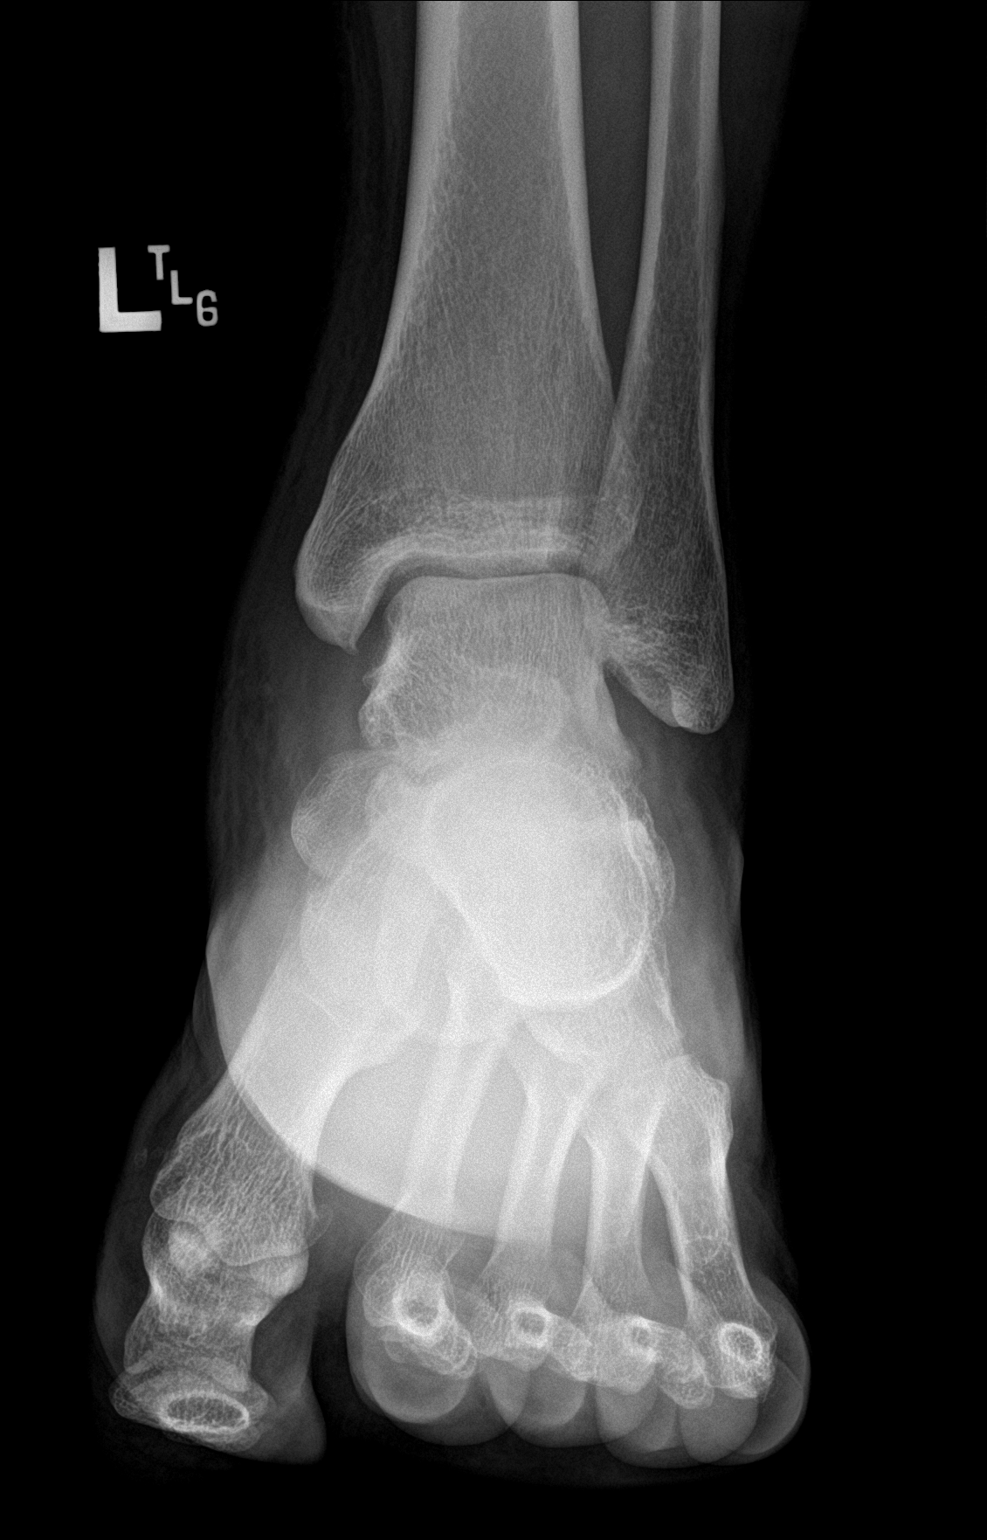

[ankle obl]
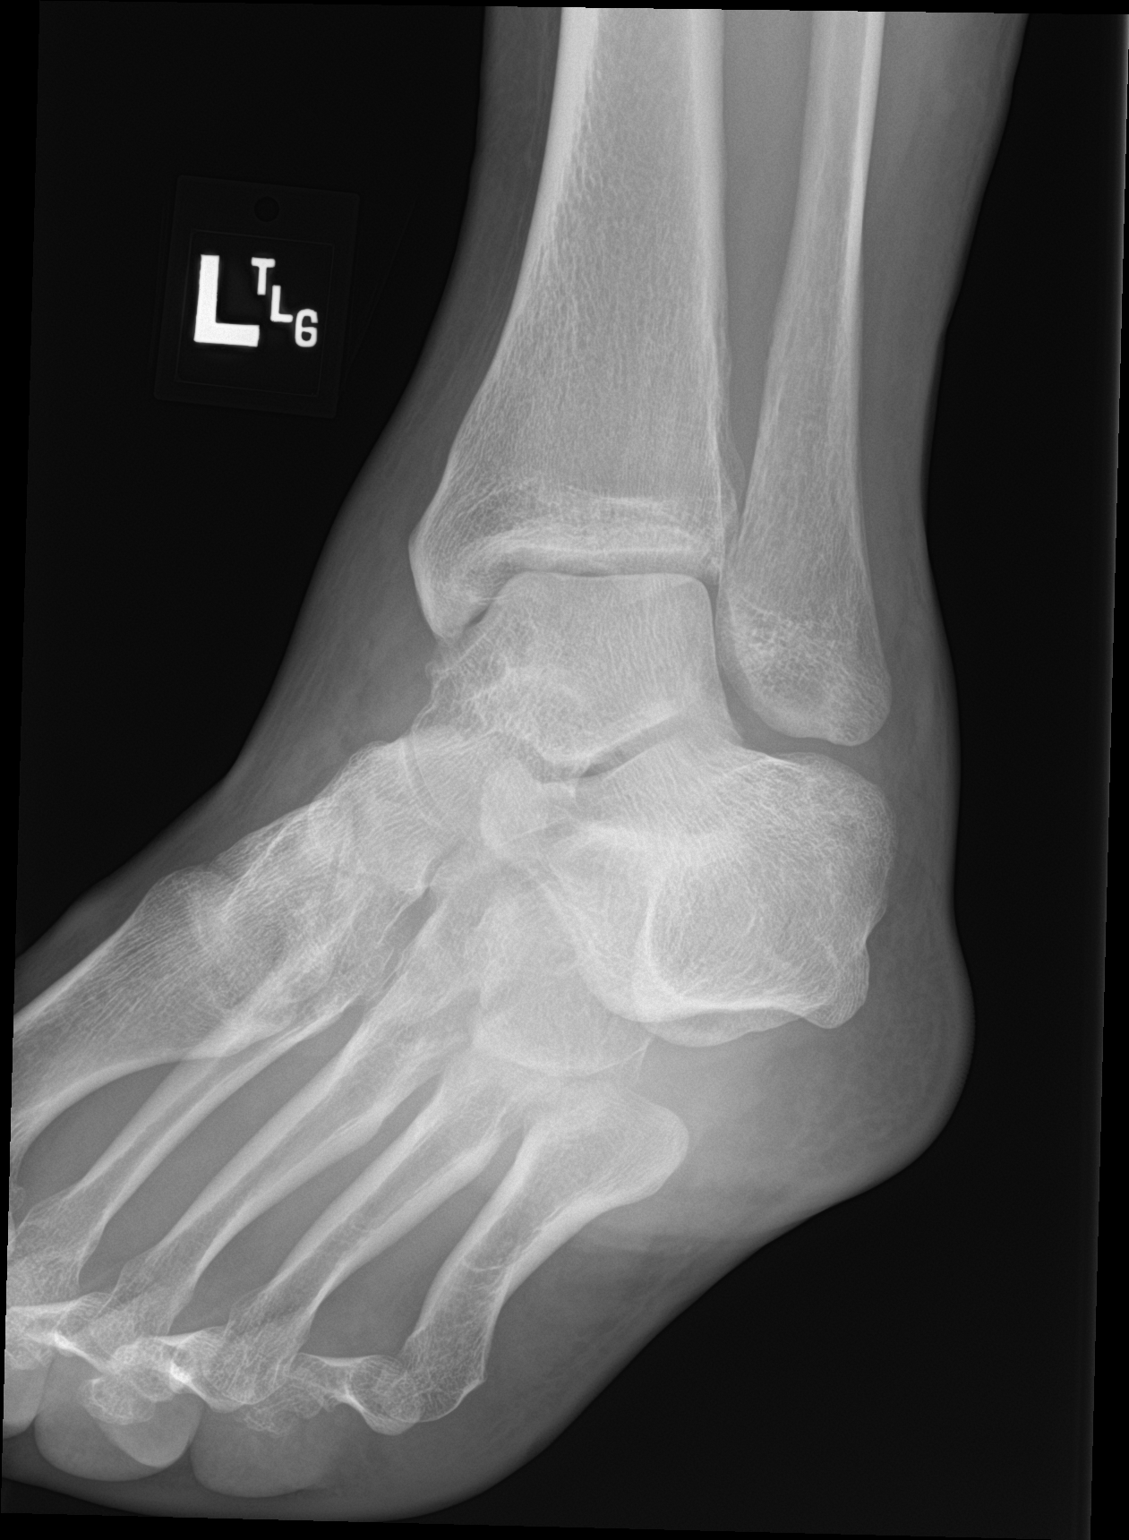

[ankle lat]
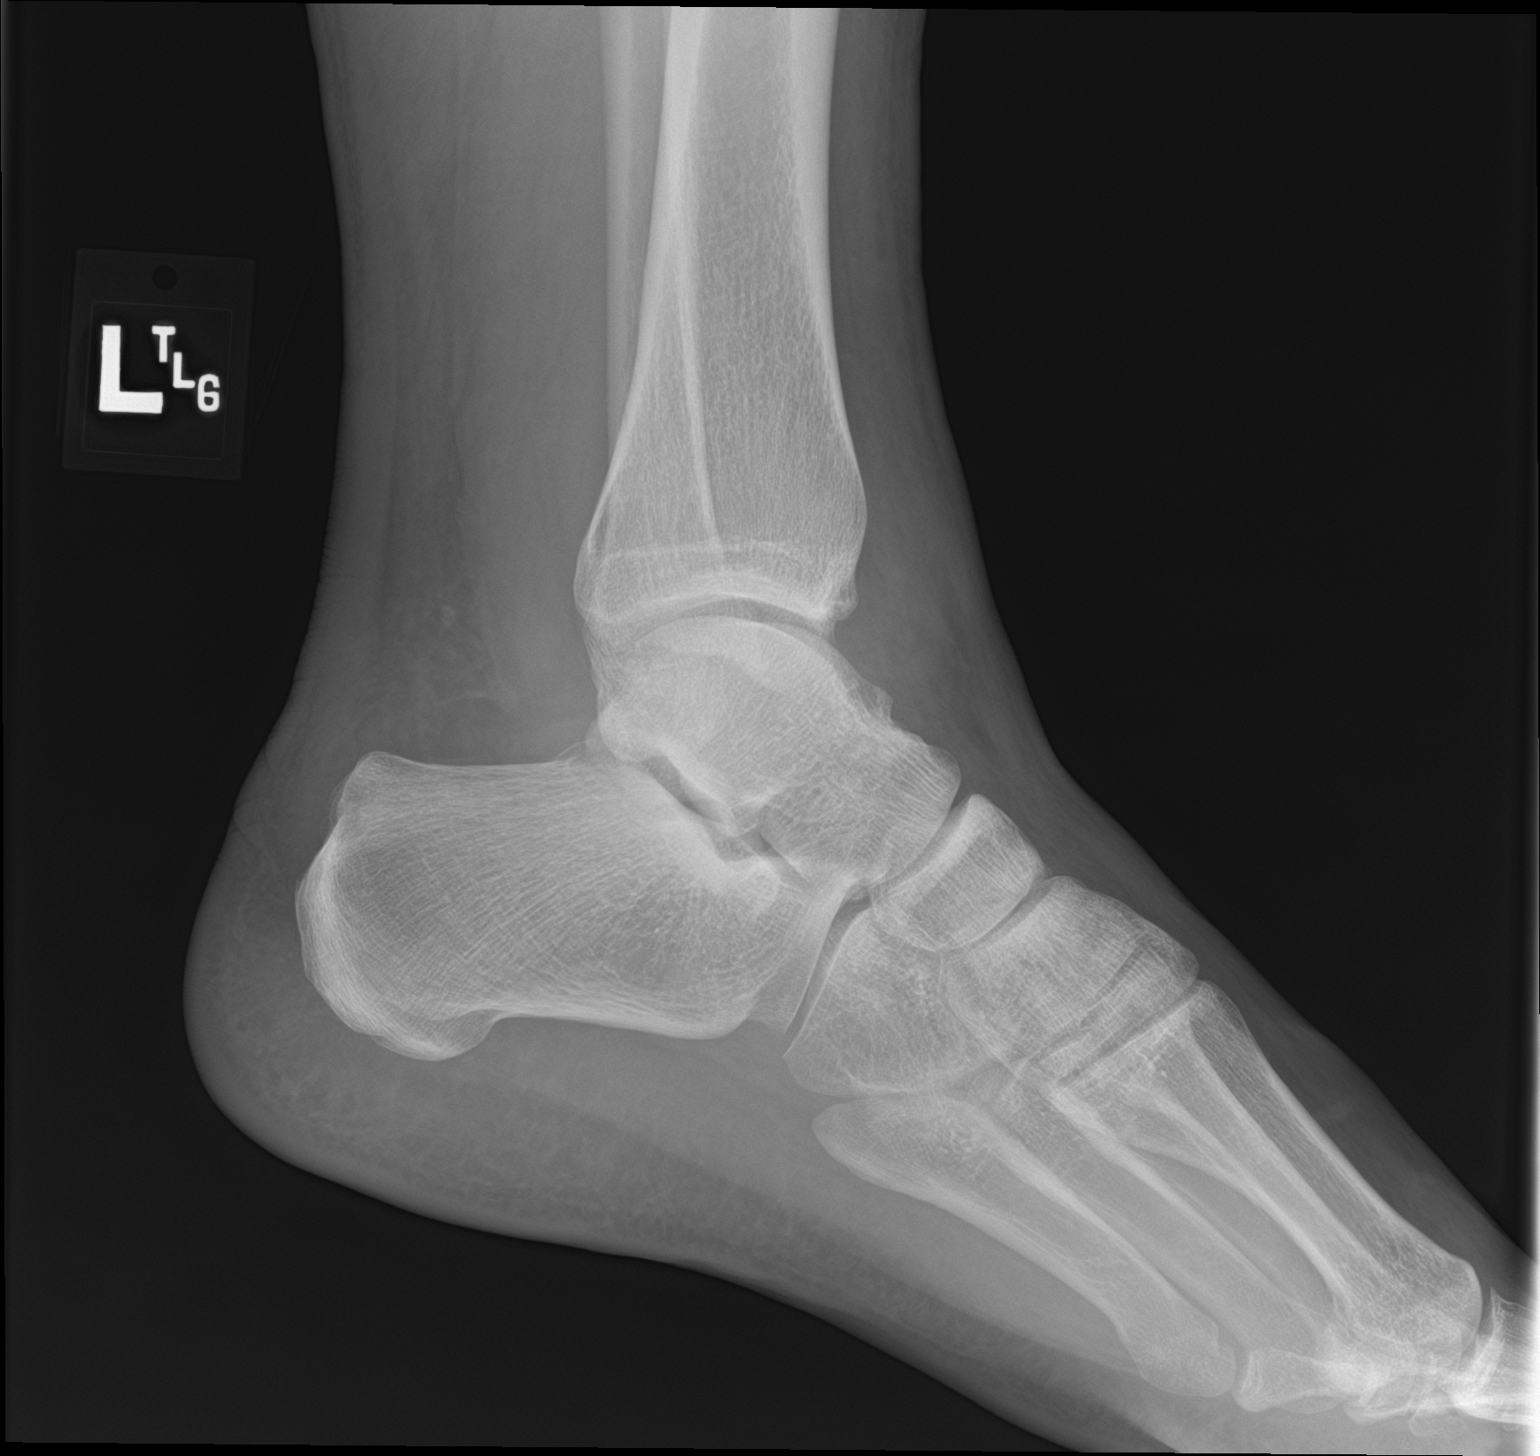

[3 of 3 positions shown; findings below may reference images not displayed]

FINDINGS: No fracture or dislocation.

Mild degenerative changes medium malleolar articulation with the
medial aspect of the talar dome with small spur.

There may be an ankle joint effusion.

Soft tissue prominence greater medially.  Etiology indeterminate.
IMPRESSION: Mild degenerative changes medium malleolar articulation with the
medial aspect of the talar dome with small spur.

There may be an ankle joint effusion.

Soft tissue prominence greater medially.  Etiology indeterminate.

No fracture or dislocation.

## 2018-09-15 ENCOUNTER — Ambulatory Visit (INDEPENDENT_AMBULATORY_CARE_PROVIDER_SITE_OTHER): Payer: PRIVATE HEALTH INSURANCE | Admitting: Family Medicine

## 2018-09-15 ENCOUNTER — Encounter: Payer: Self-pay | Admitting: Family Medicine

## 2018-09-15 VITALS — BP 132/84 | HR 78 | Ht 69.0 in | Wt 244.0 lb

## 2018-09-15 DIAGNOSIS — M25572 Pain in left ankle and joints of left foot: Secondary | ICD-10-CM

## 2018-09-15 DIAGNOSIS — G43909 Migraine, unspecified, not intractable, without status migrainosus: Secondary | ICD-10-CM | POA: Insufficient documentation

## 2018-09-15 DIAGNOSIS — M25571 Pain in right ankle and joints of right foot: Secondary | ICD-10-CM

## 2018-09-15 DIAGNOSIS — M1A0710 Idiopathic chronic gout, right ankle and foot, without tophus (tophi): Secondary | ICD-10-CM

## 2018-09-15 HISTORY — DX: Morbid (severe) obesity due to excess calories: E66.01

## 2018-09-15 MED ORDER — COLCHICINE 0.6 MG PO TABS: 0.6000 mg | ORAL_TABLET | Freq: Every day | ORAL | 3 refills | Status: AC

## 2018-09-15 MED ORDER — ALLOPURINOL 300 MG PO TABS: 300.0000 mg | ORAL_TABLET | Freq: Every day | ORAL | 3 refills | Status: DC

## 2018-09-15 NOTE — Progress Notes (Signed)
SALEEM COCCIA is a 32 y.o. male who presents to [facility]: [facility] today for gout pain.  Lacy has a history of gout.  He was seen for this issue over a year ago and did well with Uloric and colchicine.  However he ran out of Uloric and has not been taking it for months.  He notes recently he developed right ankle pain and swelling.  He was able to get it back under control with leftover colchicine and over-the-counter NSAIDs.  He notes however that he would like to reestablish care for gout control.  He feels well and tolerated both Uloric and allopurinol in the past.  He notes now that he has gone more generic it has become more expensive is no longer manufacturer coupon.   ROS as above:  Exam:  BP 132/84   Pulse 78   Ht 5\' 9"  (1.753 m)   Wt 244 lb (110.7 kg)   BMI 36.03 kg/m  Wt Readings from Last 5 Encounters:  09/15/18 244 lb (110.7 kg)  10/28/17 241 lb (109.3 kg)  03/26/17 255 lb (115.7 kg)  02/24/17 255 lb (115.7 kg)  12/16/16 262 lb (118.8 kg)    Gen: Well NAD HEENT: EOMI,  MMM Lungs: Normal work of breathing. CTABL Heart: RRR no MRG Abd: NABS, Soft. Nondistended, Nontender Exts: Brisk capillary refill, warm and well perfused.  Right ankle no effusion nontender normal motion.  Lab and Radiology Results Lab Results  Component Value Date   LABURIC 7.5 10/28/2017      Assessment and Plan: 32 y.o. male with  Gout: Plan to resume allopurinol for uric acid control as well as colchicine prophylactically for the first 2 months or so.  Plan to recheck in 1 month for well adult visit.  We will also address obesity and mildly elevated blood pressure.  Will check fasting labs in about a month or so before the next visit to follow-up uric acid level as well.   Orders Placed This Encounter  Procedures  . CBC  . COMPLETE METABOLIC PANEL WITH GFR  . Uric acid    . Lipid Panel w/reflex Direct LDL   Meds ordered this encounter  Medications  . allopurinol (ZYLOPRIM) 300 MG tablet    Sig: Take 1 tablet (300 mg total) by mouth daily.    Dispense:  90 tablet    Refill:  3  . colchicine 0.6 MG tablet    Sig: Take 1 tablet (0.6 mg total) by mouth daily. For 2 months than switch to as needed    Dispense:  90 tablet    Refill:  3     Historical information moved to improve visibility of documentation.  Past Medical History:  Diagnosis Date  . Chronic gout 03/26/2017  . Genital warts 01/18/2016  . Morbid obesity (HCC) 09/15/2018  . Psoriasis 03/26/2017   History reviewed. No pertinent surgical history. Social History   Tobacco Use  . Smoking status: Never Smoker  . Smokeless tobacco: Current User  Substance Use Topics  . Alcohol use: Yes   family history includes Non-Hodgkin's lymphoma in his mother.  Medications: Current Outpatient Medications  Medication Sig Dispense Refill  . colchicine 0.6 MG tablet Take 1 tablet (0.6 mg total) by mouth daily. For 2 months than switch to as needed 90 tablet 3  . allopurinol (ZYLOPRIM) 300 MG tablet Take 1 tablet (300 mg total) by mouth daily. 90 tablet 3  No current facility-administered medications for this visit.    No Known Allergies   Discussed warning signs or symptoms. Please see discharge instructions. Patient expresses understanding. \

## 2018-09-15 NOTE — Patient Instructions (Addendum)
Thank you for coming in today. Restart allopurinol daily to prevent gout attack and lower uric acid. Take colchicine daily for first 2 months to prevent attack then just take as needed. The goal is that you should not have to take this much.   Get fasting labs in about 1 month.  Return in 1 month for physical. If you get labs ahead of the physical we can talk about results.   Allopurinol tablets What is this medicine? ALLOPURINOL (al oh PURE i nole) reduces the amount of uric acid the body makes. It is used to treat the symptoms of gout. It is also used to treat or prevent high uric acid levels that occur as a result of certain types of chemotherapy. This medicine may also help patients who frequently have kidney stones. This medicine may be used for other purposes; ask your health care provider or pharmacist if you have questions. COMMON BRAND NAME(S): Zyloprim What should I tell my health care provider before I take this medicine? They need to know if you have any of these conditions: -kidney or liver disease -an unusual or allergic reaction to allopurinol, other medicines, foods, dyes, or preservatives -pregnant or trying to get pregnant -breast feeding How should I use this medicine? Take this medicine by mouth with a glass of water. Follow the directions on the prescription label. If this medicine upsets your stomach, take it with food or milk. Take your doses at regular intervals. Do not take your medicine more often than directed. Talk to your pediatrician regarding the use of this medicine in children. Special care may be needed. While this drug may be prescribed for children as young as 6 years for selected conditions, precautions do apply. Overdosage: If you think you have taken too much of this medicine contact a poison control center or emergency room at once. NOTE: This medicine is only for you. Do not share this medicine with others. What if I miss a dose? If you miss a dose,  take it as soon as you can. If it is almost time for your next dose, take only that dose. Do not take double or extra doses. What may interact with this medicine? Do not take this medicine with the following medication: -didanosine, ddI This medicine may also interact with the following medications: -amoxicillin or ampicillin -azathioprine -certain medicines used to treat gout -certain types of diuretics -chlorpropamide -cyclosporine -dicumarol -mercaptopurine -tolbutamide -warfarin This list may not describe all possible interactions. Give your health care provider a list of all the medicines, herbs, non-prescription drugs, or dietary supplements you use. Also tell them if you smoke, drink alcohol, or use illegal drugs. Some items may interact with your medicine. What should I watch for while using this medicine? Visit your doctor or health care professional for regular checks on your progress. If you are taking this medicine to treat gout, you may not have less frequent attacks at first. Keep taking your medicine regularly and the attacks should get better within 2 to 6 weeks. Drink plenty of water (10 to 12 full glasses a day) while you are taking this medicine. This will help to reduce stomach upset and reduce the risk of getting gout or kidney stones. Call your doctor or health care professional at once if you get a skin rash together with chills, fever, sore throat, or nausea and vomiting, if you have blood in your urine, or difficulty passing urine. Do not take vitamin C without asking your doctor or health  care professional. Too much vitamin C can increase the chance of getting kidney stones. You may get drowsy or dizzy. Do not drive, use machinery, or do anything that needs mental alertness until you know how this drug affects you. Do not stand or sit up quickly, especially if you are an older patient. This reduces the risk of dizzy or fainting spells. Alcohol can make you more drowsy  and dizzy. Alcohol can also increase the chance of stomach problems and increase the amount of uric acid in your blood. Avoid alcoholic drinks. What side effects may I notice from receiving this medicine? Side effects that you should report to your doctor or health care professional as soon as possible: -allergic reactions like skin rash, itching or hives, swelling of the face, lips, or tongue -breathing problems -muscle aches or pains -redness, blistering, peeling or loosening of the skin, including inside the mouth Side effects that usually do not require medical attention (report to your doctor or health care professional if they continue or are bothersome): -changes in taste -diarrhea -indigestion -stomach pain or cramps This list may not describe all possible side effects. Call your doctor for medical advice about side effects. You may report side effects to FDA at 1-800-FDA-1088. Where should I keep my medicine? Keep out of the reach of children. Store at room temperature between 15 and 25 degrees C (59 and 77 degrees F). Protect from light and moisture. Throw away any unused medicine after the expiration date. NOTE: This sheet is a summary. It may not cover all possible information. If you have questions about this medicine, talk to your doctor, pharmacist, or health care provider.  2018 Elsevier/Gold Standard (2008-05-16 14:26:54)

## 2018-09-16 ENCOUNTER — Encounter: Payer: Self-pay | Admitting: Family Medicine

## 2018-10-19 ENCOUNTER — Encounter: Payer: PRIVATE HEALTH INSURANCE | Admitting: Family Medicine

## 2018-10-27 ENCOUNTER — Ambulatory Visit (INDEPENDENT_AMBULATORY_CARE_PROVIDER_SITE_OTHER): Payer: PRIVATE HEALTH INSURANCE | Admitting: Family Medicine

## 2018-10-27 ENCOUNTER — Encounter: Payer: Self-pay | Admitting: Family Medicine

## 2018-10-27 VITALS — BP 141/94 | HR 79 | Ht 68.0 in | Wt 238.0 lb

## 2018-10-27 DIAGNOSIS — E782 Mixed hyperlipidemia: Secondary | ICD-10-CM | POA: Insufficient documentation

## 2018-10-27 DIAGNOSIS — M1A0710 Idiopathic chronic gout, right ankle and foot, without tophus (tophi): Secondary | ICD-10-CM | POA: Diagnosis not present

## 2018-10-27 DIAGNOSIS — Z0000 Encounter for general adult medical examination without abnormal findings: Secondary | ICD-10-CM | POA: Diagnosis not present

## 2018-10-27 HISTORY — DX: Mixed hyperlipidemia: E78.2

## 2018-10-27 LAB — CBC
HCT: 40 % (ref 38.5–50.0)
HEMOGLOBIN: 14 g/dL (ref 13.2–17.1)
MCH: 31.1 pg (ref 27.0–33.0)
MCHC: 35 g/dL (ref 32.0–36.0)
MCV: 88.9 fL (ref 80.0–100.0)
MPV: 12.1 fL (ref 7.5–12.5)
Platelets: 255 10*3/uL (ref 140–400)
RBC: 4.5 10*6/uL (ref 4.20–5.80)
RDW: 13.4 % (ref 11.0–15.0)
WBC: 7.8 10*3/uL (ref 3.8–10.8)

## 2018-10-27 LAB — COMPLETE METABOLIC PANEL WITH GFR
AG RATIO: 1.6 (calc) (ref 1.0–2.5)
ALBUMIN MSPROF: 4.4 g/dL (ref 3.6–5.1)
ALT: 44 U/L (ref 9–46)
AST: 42 U/L — ABNORMAL HIGH (ref 10–40)
Alkaline phosphatase (APISO): 70 U/L (ref 40–115)
BUN: 18 mg/dL (ref 7–25)
CALCIUM: 9.4 mg/dL (ref 8.6–10.3)
CO2: 21 mmol/L (ref 20–32)
CREATININE: 0.99 mg/dL (ref 0.60–1.35)
Chloride: 103 mmol/L (ref 98–110)
GFR, EST AFRICAN AMERICAN: 118 mL/min/{1.73_m2} (ref >=60)
GFR, EST NON AFRICAN AMERICAN: 102 mL/min/{1.73_m2} (ref >=60)
GLOBULIN: 2.8 g/dL (ref 1.9–3.7)
Glucose, Bld: 106 mg/dL — ABNORMAL HIGH (ref 65–99)
POTASSIUM: 4.6 mmol/L (ref 3.5–5.3)
SODIUM: 139 mmol/L (ref 135–146)
Total Bilirubin: 0.4 mg/dL (ref 0.2–1.2)
Total Protein: 7.2 g/dL (ref 6.1–8.1)

## 2018-10-27 LAB — LIPID PANEL W/REFLEX DIRECT LDL
CHOL/HDL RATIO: 11 (calc) — AB (ref <5.0)
Cholesterol: 287 mg/dL — ABNORMAL HIGH (ref <200)
HDL: 26 mg/dL — ABNORMAL LOW (ref >40)
NON-HDL CHOLESTEROL (CALC): 261 mg/dL — AB (ref <130)
Triglycerides: 3150 mg/dL — ABNORMAL HIGH (ref <150)

## 2018-10-27 LAB — DIRECT LDL

## 2018-10-27 LAB — URIC ACID: Uric Acid, Serum: 7.4 mg/dL (ref 4.0–8.0)

## 2018-10-27 MED ORDER — ICOSAPENT ETHYL 1 G PO CAPS: 2.0000 g | ORAL_CAPSULE | Freq: Two times a day (BID) | ORAL | 3 refills | Status: DC

## 2018-10-27 MED ORDER — ATORVASTATIN CALCIUM 80 MG PO TABS: 80.0000 mg | ORAL_TABLET | Freq: Every day | ORAL | 3 refills | Status: DC

## 2018-10-27 MED ORDER — VALACYCLOVIR HCL 500 MG PO TABS: 500.0000 mg | ORAL_TABLET | Freq: Two times a day (BID) | ORAL | 12 refills | Status: AC

## 2018-10-27 NOTE — Progress Notes (Signed)
Calvin Rubio is a 32 y.o. male [facility] today for well adult visit.  Calvin MaduroRobert is doing reasonably well.  He was seen over a month ago and was started on allopurinol for gout.  He no longer is taking colchicine and notes that he has not had any gout flares.  He had fasting labs in preparation today's visit.  He feels well with things are going.  He has improved his diet in an effort to lose weight.  He has had some weight loss.  Additionally he is cut back on his drinking significantly.  He typically would drink over 10 beers on the weekend watching football game with his friends and notes that he is cut back on that significantly.  He thinks this is helped his weight as well as his scalp.  He denies any personal history of significant hypertriglyceridemia.  Unfortunately his labs showed significantly elevated triglycerides of over 3000.  He notes that he was fasting over 6 hours for that lab.   ROS as above:  Past Medical History:  Diagnosis Date  . Chronic gout 03/26/2017  . Elevated triglycerides with high cholesterol 10/27/2018  . Genital warts 01/18/2016  . Morbid obesity (HCC) 09/15/2018  . Psoriasis 03/26/2017   History reviewed. No pertinent surgical history. Social History   Tobacco Use  . Smoking status: Never Smoker  . Smokeless tobacco: Current User  Substance Use Topics  . Alcohol use: Yes   family history includes Non-Hodgkin's lymphoma in his mother.  Medications: Current Outpatient Medications  Medication Sig Dispense Refill  . allopurinol (ZYLOPRIM) 300 MG tablet Take 1 tablet (300 mg total) by mouth daily. 90 tablet 3  . colchicine 0.6 MG tablet Take 1 tablet (0.6 mg total) by mouth daily. For 2 months than switch to as needed 90 tablet 3  . atorvastatin (LIPITOR) 80 MG tablet Take 1 tablet (80 mg total) by mouth daily. 90 tablet 3    . Icosapent Ethyl 1 g CAPS Take 2 capsules (2 g total) by mouth 2 (two) times daily. 360 capsule 3  . valACYclovir (VALTREX) 500 MG tablet Take 1 tablet (500 mg total) by mouth 2 (two) times daily. 6 tablet 12   No current facility-administered medications for this visit.    No Known Allergies  Health Maintenance Health Maintenance  Topic Date Due  . HIV Screening  09/16/2019 (Originally 09/04/2003)  . TETANUS/TDAP  11/13/2027  . INFLUENZA VACCINE  Completed     Exam:  BP (!) 141/94   Pulse 79   Ht 5\' 8"  (1.727 m)   Wt 238 lb (108 kg)   BMI 36.19 kg/m  Wt Readings from Last 5 Encounters:  10/27/18 238 lb (108 kg)  09/15/18 244 lb (110.7 kg)  10/28/17 241 lb (109.3 kg)  03/26/17 255 lb (115.7 kg)  02/24/17 255 lb (115.7 kg)      Gen: Well NAD HEENT: EOMI,  MMM Lungs: Normal work of breathing. CTABL Heart: RRR no MRG Abd: NABS, Soft. Nondistended, Nontender Exts: Brisk capillary refill, warm and well perfused.  Psych: Alert and oriented normal speech thought process and affect.  Dep[facility] screen [facility] 10/27/2018 09/16/2018  Decreased Interest 0 0  Down, Depressed, Hopeless 0 0  PHQ - 2 Score 0 0  Altered sleeping 0 -  Tired, decreased energy 1 -  Change in appetite 0 -  Feeling bad or failure about yourself  0 -  Trouble concentrating 0 -  Moving slowly or fidgety/restless 0 -  Suicidal thoughts 0 -  PHQ-9 Score 1 -  Difficult doing work/chores Not difficult at all -       Lab and Radiology Results Results for orders placed or performed in visit on 09/15/18 (from the past 72 hour(s))  CBC     Status: None   Collection Time: 10/26/18  7:38 AM  Result Value Ref Range   WBC 7.8 3.8 - 10.8 Thousand/uL   RBC 4.50 4.20 - 5.80 Million/uL   Hemoglobin 14.0 13.2 - 17.1 g/dL   HCT 16.1 09.6 - 04.5 %   MCV 88.9 80.0 - 100.0 fL   MCH 31.1 27.0 - 33.0 pg   MCHC 35.0 32.0 - 36.0 g/dL    Comment: Verified by repeat analysis. Marland Kitchen Specimen was prewarmed to 37  degrees to obtain results. Cold agglutinin/cryoglobulin suspected.    RDW 13.4 11.0 - 15.0 %   Platelets 255 140 - 400 Thousand/uL   MPV 12.1 7.5 - 12.5 fL  COMPLETE METABOLIC PANEL WITH GFR     Status: Abnormal   Collection Time: 10/26/18  7:38 AM  Result Value Ref Range   Glucose, Bld 106 (H) 65 - 99 mg/dL    Comment: Verified by repeat analysis. Marland Kitchen Specimen lipemic. Cholesterol, HDL and/or Triglyceride  results (if ordered) were obtained from original specimen. Results for other analytes impacted by lipemia  were obtained after ultracentrifugation of the specimen  to remove interfering lipids. .            Fasting reference interval . For someone without known diabetes, a glucose value between 100 and 125 mg/dL is consistent with prediabetes and should be confirmed with a follow-up test. .    BUN 18 7 - 25 mg/dL   Creat 4.09 8.11 - 9.14 mg/dL   GFR, Est Non African American 102 > OR = 60 mL/min/1.27m2   GFR, Est African American 118 > OR = 60 mL/min/1.30m2   BUN/Creatinine Ratio NOT APPLICABLE 6 - 22 (calc)   Sodium 139 135 - 146 mmol/L    Comment: Verified by repeat analysis. .    Potassium 4.6 3.5 - 5.3 mmol/L    Comment: Verified by repeat analysis. .    Chloride 103 98 - 110 mmol/L    Comment: Verified by repeat analysis. .    CO2 21 20 - 32 mmol/L   Calcium 9.4 8.6 - 10.3 mg/dL   Total Protein 7.2 6.1 - 8.1 g/dL   Albumin 4.4 3.6 - 5.1 g/dL   Globulin 2.8 1.9 - 3.7 g/dL (calc)   AG Ratio 1.6 1.0 - 2.5 (calc)   Total Bilirubin 0.4 0.2 - 1.2 mg/dL    Comment: Verified by repeat analysis. .    Alkaline phosphatase (APISO) 70 40 - 115 U/L   AST 42 (H) 10 - 40 U/L   ALT 44 9 - 46 U/L  Uric acid     Status: None   Collection Time: 10/26/18  7:38 AM  Result Value Ref Range   Uric Acid, Serum 7.4 4.0 - 8.0 mg/dL    Comment: Therapeutic target for gout patients: <6.0 mg/dL .   Lipid Panel w/reflex Direct LDL     Status: Abnormal   Collection Time:  10/26/18  7:38 AM  Result Value Ref Range   Cholesterol 287 (H) <200 mg/dL   HDL 26 (L) >78 mg/dL   Triglycerides 2,956 (H) <150 mg/dL    Comment: Verified  by repeat analysis. Marland Kitchen . If a non-fasting specimen was collected, consider repeat triglyceride testing on a fasting specimen if clinically indicated.  Calvin Rubio et al. J. of Clin. Lipidol. 2015;9:129-169. . . There is increased risk of pancreatitis when the  triglyceride concentration is very high  (> or = 500 mg/dL, especially if > or = 4098 mg/dL).  Calvin Rubio et al. J. of Clin. Lipidol. 2015;9:129-169. Marland Kitchen    LDL Cholesterol (Calc)  mg/dL (calc)    Comment: . LDL cholesterol not calculated. Triglyceride levels greater than 400 mg/dL invalidate calculated LDL results. . Reference range: <100 . Desirable range <100 mg/dL for primary prevention;   <70 mg/dL for patients with CHD or diabetic patients  with > or = 2 CHD risk factors. Marland Kitchen LDL-C is now calculated using the Martin-Hopkins  calculation, which is a validated novel method providing  better accuracy than the Friedewald equation in the  estimation of LDL-C.  Horald Pollen et al. Lenox Ahr. 1191;478(29): 2061-2068  (http://education.[employer].com/faq/FAQ164)    Total CHOL/HDL Ratio 11.0 (H) <5.0 (calc)   Non-HDL Cholesterol (Calc) 261 (H) <130 mg/dL (calc)    Comment: Non-HDL level > or = 220 is very high and may indicate  genetic familial hypercholesterolemia (FH). Clinical  assessment and measurement of blood lipid levels  should be considered for all first-degree relatives  of patients with an FH diagnosis. . For patients with diabetes plus 1 major ASCVD risk  factor, treating to a non-HDL-C goal of <100 mg/dL  (LDL-C of <56 mg/dL) is considered a therapeutic  option.   DIRECT LDL     Status: None   Collection Time: 10/26/18  7:38 AM  Result Value Ref Range   Direct LDL CANCELED     Comment: Test not performed. Extremely high  Triglycerides values (>1293  mg/dL)  interfere with the dLDL assay.  Marland Kitchen Desirable range <100 mg/dL for primary prevention;   <70 mg/dL for patients with CHD or diabetic patients  with > or = 2 CHD risk factors. .  Result canceled by the ancillary.    No results found.    Assessment and Plan: 32 y.o. male with  Well adult.  Significant health improvement over the last month with lifestyle change.  Largest issue today however is significantly elevated triglycerides.  His fasting triglycerides were greater than 3000.  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  Plan to start Vascepa and Lipitor and recheck labs in about 6 weeks.  Additionally will refer to cardiology lipid clinic as he may benefit from further care.  May change plan based on feedback after initial referral request.  Patient has successfully lost some weight with lifestyle change.  This is excellent.  He had been drinking a bit heavily especially the weekends and he is cut back significantly which I think is a large factor his weight loss.  Heavy alcohol use may be a factor in triglycerides as well.  However he did not have significantly elevated liver enzymes on lab check recently. Alcohol counseling today as well.  Gout seems to be doing well.  Uric acid not at goal.  Plan to recheck in about 6 weeks.  If still not at goal will titrate allopurinol up a bit.    Orders Placed This Encounter  Procedures  . Ambulatory referral to Cardiology    Referral Priority:   Routine    Referral Type:   Consultation    Referral Reason:   Specialty Services Required    Referred  to Provider:   Chrystie Nose, MD    Requested Specialty:   Cardiology    Number of Visits Requested:   1   Meds ordered this encounter  Medications  . atorvastatin (LIPITOR) 80 MG tablet    Sig: Take 1 tablet (80 mg total) by mouth daily.    Dispense:  90 tablet    Refill:  3  . Icosapent Ethyl 1 g CAPS    Sig: Take 2 capsules (2 g total) by mouth 2 (two) times  daily.    Dispense:  360 capsule    Refill:  3  . valACYclovir (VALTREX) 500 MG tablet    Sig: Take 1 tablet (500 mg total) by mouth 2 (two) times daily.    Dispense:  6 tablet    Refill:  12     Discussed warning signs or symptoms. Please see discharge instructions. Patient expresses understanding.

## 2018-10-27 NOTE — Patient Instructions (Addendum)
Thank you for coming in today. Continue allopurinol to lower uric acid.  Use colochicine as needed.  Start atrovistatin for cholesterol.  Start Vascepa twice daily for triglycerides.  I will discuss you labs with a cardiologist that does lipids and get some recommendation.   I think recheck labs in 6 weeks is a good idea. I can order that without seeing you.   Keep in touch via mychart.     Icosapent ethyl capsules What is this medicine? ICOSAPENT ETHYL (eye KOE sa pent eth il) contains essential fats. It is used to treat high triglyceride levels. This medicine may be used for other purposes; ask your health care provider or pharmacist if you have questions. COMMON BRAND NAME(S): VASCEPA What should I tell my health care provider before I take this medicine? They need to know if you have any of these conditions: -bleeding disorders -liver disease -an unusual or allergic reaction to icosapent ethyl, fish, shellfish, other medicines, foods, dyes, or preservatives -pregnant or trying to get pregnant -breast-feeding How should I use this medicine? Take this medicine by mouth with a glass of water. Follow the directions on the prescription label. Take this medicine with food. Do not cut, crush or chew this medicine. Take your medicine at regular intervals. Do not take it more often than directed. Do not stop taking except on your doctor's advice. Talk to your pediatrician regarding the use of this medicine in children. Special care may be needed. Overdosage: If you think you have taken too much of this medicine contact a poison control center or emergency room at once. NOTE: This medicine is only for you. Do not share this medicine with others. What if I miss a dose? If you miss a dose, take it as soon as you can. If it is almost time for your next dose, take only that dose. Do not take double or extra doses. What may interact with this medicine? This medicine may interact with the  following medications: -aspirin and aspirin-like medicines -beta-blockers like metoprolol and propranolol -certain medicines that treat or prevent blood clots like warfarin, enoxaparin, dalteparin, apixaban, dabigatran, and rivaroxaban -diuretics -male hormones, like estrogens and birth control pills This list may not describe all possible interactions. Give your health care provider a list of all the medicines, herbs, non-prescription drugs, or dietary supplements you use. Also tell them if you smoke, drink alcohol, or use illegal drugs. Some items may interact with your medicine. What should I watch for while using this medicine? You may need blood work done while you are taking this medicine. Follow a good diet and exercise plan. Taking this medicine does not replace a healthy lifestyle. Some foods that have omega-3 fatty acids naturally are fatty fish like albacore tuna, halibut, herring, mackerel, lake trout, salmon, and sardines. If you are scheduled for any medical or dental procedure, tell your healthcare provider that you are taking this medicine. You may need to stop taking this medicine before the procedure. What side effects may I notice from receiving this medicine? Side effects that you should report to your doctor or health care professional as soon as possible: -allergic reactions like skin rash, itching or hives, swelling of the face, lips, or tongue -breathing problems -unusual bleeding or bruising Side effects that usually do not require medical attention (report to your doctor or health care professional if they continue or are bothersome): -joint pain -sore throat This list may not describe all possible side effects. Call your doctor for medical  advice about side effects. You may report side effects to FDA at 1-800-FDA-1088. Where should I keep my medicine? Keep out of the reach of children. Store at room temperature between 15 and 30 degrees C (59 and 86 degrees F).  Throw away any unused medicine after the expiration date. NOTE: This sheet is a summary. It may not cover all possible information. If you have questions about this medicine, talk to your doctor, pharmacist, or health care provider.  2018 Elsevier/Gold Standard (2015-12-14 13:40:36)  Atorvastatin tablets What is this medicine? ATORVASTATIN (a TORE va sta tin) is known as a HMG-CoA reductase inhibitor or 'statin'. It lowers the level of cholesterol and triglycerides in the blood. This drug may also reduce the risk of heart attack, stroke, or other health problems in patients with risk factors for heart disease. Diet and lifestyle changes are often used with this drug. This medicine may be used for other purposes; ask your health care provider or pharmacist if you have questions. COMMON BRAND NAME(S): Lipitor What should I tell my health care provider before I take this medicine? They need to know if you have any of these conditions: -frequently drink alcoholic beverages -history of stroke, TIA -kidney disease -liver disease -muscle aches or weakness -other medical condition -an unusual or allergic reaction to atorvastatin, other medicines, foods, dyes, or preservatives -pregnant or trying to get pregnant -breast-feeding How should I use this medicine? Take this medicine by mouth with a glass of water. Follow the directions on the prescription label. You can take this medicine with or without food. Take your doses at regular intervals. Do not take your medicine more often than directed. Talk to your pediatrician regarding the use of this medicine in children. While this drug may be prescribed for children as young as 30 years old for selected conditions, precautions do apply. Overdosage: If you think you have taken too much of this medicine contact a poison control center or emergency room at once. NOTE: This medicine is only for you. Do not share this medicine with others. What if I miss a  dose? If you miss a dose, take it as soon as you can. If it is almost time for your next dose, take only that dose. Do not take double or extra doses. What may interact with this medicine? Do not take this medicine with any of the following medications: -red yeast rice -telaprevir -telithromycin -voriconazole This medicine may also interact with the following medications: -alcohol -antiviral medicines for HIV or AIDS -boceprevir -certain antibiotics like clarithromycin, erythromycin, troleandomycin -certain medicines for cholesterol like fenofibrate or gemfibrozil -cimetidine -clarithromycin -colchicine -cyclosporine -digoxin -male hormones, like estrogens or progestins and birth control pills -grapefruit juice -medicines for fungal infections like fluconazole, itraconazole, ketoconazole -niacin -rifampin -spironolactone This list may not describe all possible interactions. Give your health care provider a list of all the medicines, herbs, non-prescription drugs, or dietary supplements you use. Also tell them if you smoke, drink alcohol, or use illegal drugs. Some items may interact with your medicine. What should I watch for while using this medicine? Visit your doctor or health care professional for regular check-ups. You may need regular tests to make sure your liver is working properly. Tell your doctor or health care professional right away if you get any unexplained muscle pain, tenderness, or weakness, especially if you also have a fever and tiredness. Your doctor or health care professional may tell you to stop taking this medicine if you develop muscle problems. If  your muscle problems do not go away after stopping this medicine, contact your health care professional. This drug is only part of a total heart-health program. Your doctor or a dietician can suggest a low-cholesterol and low-fat diet to help. Avoid alcohol and smoking, and keep a proper exercise schedule. Do not  use this drug if you are pregnant or breast-feeding. Serious side effects to an unborn child or to an infant are possible. Talk to your doctor or pharmacist for more information. This medicine may affect blood sugar levels. If you have diabetes, check with your doctor or health care professional before you change your diet or the dose of your diabetic medicine. If you are going to have surgery tell your health care professional that you are taking this drug. What side effects may I notice from receiving this medicine? Side effects that you should report to your doctor or health care professional as soon as possible: -allergic reactions like skin rash, itching or hives, swelling of the face, lips, or tongue -dark urine -fever -joint pain -muscle cramps, pain -redness, blistering, peeling or loosening of the skin, including inside the mouth -trouble passing urine or change in the amount of urine -unusually weak or tired -yellowing of eyes or skin Side effects that usually do not require medical attention (report to your doctor or health care professional if they continue or are bothersome): -constipation -heartburn -stomach gas, pain, upset This list may not describe all possible side effects. Call your doctor for medical advice about side effects. You may report side effects to FDA at 1-800-FDA-1088. Where should I keep my medicine? Keep out of the reach of children. Store at room temperature between 20 to 25 degrees C (68 to 77 degrees F). Throw away any unused medicine after the expiration date. NOTE: This sheet is a summary. It may not cover all possible information. If you have questions about this medicine, talk to your doctor, pharmacist, or health care provider.  2018 Elsevier/Gold Standard (2011-10-01 16:10:96)

## 2018-10-28 ENCOUNTER — Telehealth: Payer: Self-pay | Admitting: Family Medicine

## 2018-10-28 DIAGNOSIS — E782 Mixed hyperlipidemia: Secondary | ICD-10-CM

## 2018-10-28 DIAGNOSIS — M1A0710 Idiopathic chronic gout, right ankle and foot, without tophus (tophi): Secondary | ICD-10-CM

## 2018-10-28 NOTE — Telephone Encounter (Signed)
-----   Message from Chrystie NoseKenneth C Hilty, MD sent at 10/27/2018  5:19 PM EST ----- Regarding: RE: TG >3000 No .. I think that is sufficient. I'll investigate further. Thanks.  -Italyhad  ----- Message ----- From: Rodolph Bongorey, Evan S, MD Sent: 10/27/2018   4:29 PM EST To: Chrystie NoseKenneth C Hilty, MD Subject: RE: TG >3000                                   The lab test I ordered does reflex to direct LDL if triglycerides are elevated however the lab could not do a direct LDL because his triglycerides were above the threshold for even that test.  Would he benefit from any other testing besides my planned recheck lipid panel metabolic panel and uric acid in 6 weeks?  Thanks, Clayburn PertEvan ----- Message ----- From: Chrystie NoseHilty, Kenneth C, MD Sent: 10/27/2018   3:43 PM EST To: Rodolph BongEvan S Corey, MD Subject: RE: TG >3000                                   Thanks Clayburn PertEvan for the referral - that sounds very appropriate. I would also make sure he has a direct LDL since we can't calculate his LDL.  Look forward to seeing him in clinic!  -Italyhad  ----- Message ----- From: Rodolph Bongorey, Evan S, MD Sent: 10/27/2018   3:38 PM EST To: Chrystie NoseKenneth C Hilty, MD Subject: TG >3000                                       Hello Dr Rennis GoldenHilty, I will be referring this patient to you unless you say otherwise. He had a truly fasting lipid panel with TG>3000 recently. 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  I think he probably would be a good candidate for your clinic.  Do you want me to get any additional labs in preparation to a visit with you?  I plan on rechecking lipid panel metabolic panel and uric acid (he has gout also of course) in about 6 weeks unless you would like me to get something else.  I appreciate your feedback and help with this patient.  Clayburn PertEvan

## 2018-10-28 NOTE — Telephone Encounter (Signed)
  Spoke to patient advised him that labs are already ordered. Calvin Rubio,CMA

## 2018-10-28 NOTE — Telephone Encounter (Signed)
I spoke with Dr. Rennis GoldenHilty a cardiologist who has a special focus on people like you with very abnormal blood lipids. I have referred you to him and you should be hearing about an appointment in the near future.  Please let me know if you do not hear anything.  He agrees that it is reasonable to recheck labs in about 4 to 6 weeks.  I will put the order in and we should get these labs mid-January.  Labs should be done fasting.  You do not need to see me to get these labs done.

## 2018-10-28 NOTE — Telephone Encounter (Signed)
Left VM for Pt to return clinic call.  

## 2018-11-09 ENCOUNTER — Telehealth: Payer: Self-pay | Admitting: Internal Medicine

## 2018-11-09 NOTE — Telephone Encounter (Signed)
[facility]

## 2018-11-10 ENCOUNTER — Telehealth: Payer: Self-pay | Admitting: Internal Medicine

## 2018-11-10 NOTE — Telephone Encounter (Signed)
Called patient and LVM to call back to schedule appointment with Dr. Rennis GoldenHilty for elevated triglycerides with high cholesterol.

## 2018-12-07 ENCOUNTER — Telehealth: Payer: Self-pay | Admitting: Family Medicine

## 2018-12-07 DIAGNOSIS — E782 Mixed hyperlipidemia: Secondary | ICD-10-CM

## 2018-12-07 DIAGNOSIS — Z5181 Encounter for therapeutic drug level monitoring: Secondary | ICD-10-CM

## 2018-12-07 DIAGNOSIS — M1A0710 Idiopathic chronic gout, right ankle and foot, without tophus (tophi): Secondary | ICD-10-CM

## 2018-12-07 NOTE — Telephone Encounter (Signed)
Left VM with recommendation  

## 2018-12-07 NOTE — Telephone Encounter (Signed)
-----   Message from Rodolph Bong, MD sent at 10/28/2018 12:58 PM EST ----- Regarding: Recheck Lipid Started on Lipitor and Vascepa for super high triglycerides.  Plan to recheck lipid panel metabolic panel and uric acid 4 to 6 weeks.  Labs already ordered.  If not done we should notify patient to get these labs done.

## 2018-12-07 NOTE — Telephone Encounter (Signed)
Labs ordered to recheck cholesterol kidney function and uric acid after starting medications.  Labs should be done fasting.  Please swing by the lab and get them done.

## 2018-12-08 ENCOUNTER — Encounter: Payer: Self-pay | Admitting: Family Medicine

## 2018-12-08 NOTE — Telephone Encounter (Signed)
Patient scheduled for appointment.

## 2018-12-09 ENCOUNTER — Encounter: Payer: Self-pay | Admitting: Family Medicine

## 2018-12-09 ENCOUNTER — Ambulatory Visit: Payer: PRIVATE HEALTH INSURANCE | Admitting: Family Medicine

## 2018-12-09 VITALS — BP 126/85 | HR 81 | Ht 68.0 in | Wt 236.0 lb

## 2018-12-09 DIAGNOSIS — E782 Mixed hyperlipidemia: Secondary | ICD-10-CM

## 2018-12-09 DIAGNOSIS — R7401 Elevation of levels of liver transaminase levels: Secondary | ICD-10-CM | POA: Insufficient documentation

## 2018-12-09 DIAGNOSIS — R079 Chest pain, unspecified: Secondary | ICD-10-CM

## 2018-12-09 DIAGNOSIS — M1A0710 Idiopathic chronic gout, right ankle and foot, without tophus (tophi): Secondary | ICD-10-CM | POA: Diagnosis not present

## 2018-12-09 DIAGNOSIS — R740 Nonspecific elevation of levels of transaminase and lactic acid dehydrogenase [LDH]: Secondary | ICD-10-CM

## 2018-12-09 LAB — COMPLETE METABOLIC PANEL WITH GFR
AG RATIO: 2 (calc) (ref 1.0–2.5)
ALT: 82 U/L — ABNORMAL HIGH (ref 9–46)
AST: 49 U/L — ABNORMAL HIGH (ref 10–40)
Albumin: 4.7 g/dL (ref 3.6–5.1)
Alkaline phosphatase (APISO): 65 U/L (ref 40–115)
BUN: 13 mg/dL (ref 7–25)
CO2: 27 mmol/L (ref 20–32)
Calcium: 9.2 mg/dL (ref 8.6–10.3)
Chloride: 100 mmol/L (ref 98–110)
Creat: 0.97 mg/dL (ref 0.60–1.35)
GFR, Est African American: 121 mL/min/{1.73_m2} (ref >=60)
GFR, Est Non African American: 104 mL/min/{1.73_m2} (ref >=60)
GLOBULIN: 2.3 g/dL (ref 1.9–3.7)
Glucose, Bld: 96 mg/dL (ref 65–99)
Potassium: 4.1 mmol/L (ref 3.5–5.3)
SODIUM: 138 mmol/L (ref 135–146)
Total Bilirubin: 0.8 mg/dL (ref 0.2–1.2)
Total Protein: 7 g/dL (ref 6.1–8.1)

## 2018-12-09 LAB — LIPID PANEL W/REFLEX DIRECT LDL
Cholesterol: 115 mg/dL (ref <200)
HDL: 28 mg/dL — ABNORMAL LOW (ref >40)
LDL Cholesterol (Calc): 54 mg/dL (calc)
Non-HDL Cholesterol (Calc): 87 mg/dL (calc) (ref <130)
Total CHOL/HDL Ratio: 4.1 (calc) (ref <5.0)
Triglycerides: 312 mg/dL — ABNORMAL HIGH (ref <150)

## 2018-12-09 LAB — URIC ACID: Uric Acid, Serum: 8.3 mg/dL — ABNORMAL HIGH (ref 4.0–8.0)

## 2018-12-09 MED ORDER — ROSUVASTATIN CALCIUM 20 MG PO TABS: 20.0000 mg | ORAL_TABLET | Freq: Every day | ORAL | 3 refills | Status: DC

## 2018-12-09 MED ORDER — ALLOPURINOL 300 MG PO TABS: 450.0000 mg | ORAL_TABLET | Freq: Every day | ORAL | 3 refills | Status: AC

## 2018-12-09 NOTE — Progress Notes (Signed)
Calvin Rubio is a 33 y.o. male who presents to [facility]: [facility] Care [facility] today for follow-up hyperlipidemia and chest pain.  Calvin Rubio has a history of significant Hypertriglyceridemia. His TG was over 3000. He was started on Lipitor 80 and Vascepa 2g bid.  He notes that he has tolerated these medications pretty well.  He denies significant full body soreness or GI upset.  He is eager to see the results of his lipids today.  He has an initial consultation with cardiology lipid allergy in the end of February.    Additionally he notes some subtle left-sided chest pain.  He notes soreness in his upper left chest.  He notes the pain comes and goes and is mild.  He does not think the pain is related to exertion or food.  He denies any shortness of breath or palpitations with the pain.  Additionally he is able to exert himself fully with no issues.  He is not sure if the pain started when he started his medications but he thinks perhaps it may be related to medications.  Additionally he notes that he is drinking grapefruit juice pretty frequently.  He looked it up and is worried about its interaction with the Lipitor.  Additionally he has a history of gout and currently takes allopurinol 300 mg daily.  He has not had a gout flare in quite some time and is not taking colchicine regularly.  He notes that he has been less adherent to his diet over the holidays.   ROS as above:  Exam:  BP 126/85   Pulse 81   Ht 5\' 8"  (1.727 m)   Wt 236 lb (107 kg)   BMI 35.88 kg/m  Wt Readings from Last 5 Encounters:  12/09/18 236 lb (107 kg)  10/27/18 238 lb (108 kg)  09/15/18 244 lb (110.7 kg)  10/28/17 241 lb (109.3 kg)  03/26/17 255 lb (115.7 kg)    Gen: Well NAD HEENT: EOMI,  MMM Lungs: Normal work of breathing. CTABL Chest wall: Tender to palpation along costal sternal border left chest wall.  Some  pain with shoulder and arm motion. Heart: RRR no MRG Abd: NABS, Soft. Nondistended, Nontender Exts: Brisk capillary refill, warm and well perfused.   Lab and Radiology Results  Twelve-lead EKG shows normal sinus rhythm with a rate of 75 bpm.  No ST segment elevation or depression.  Normal EKG.   Results for orders placed or performed in visit on 12/07/18 (from the past 72 hour(s))  COMPLETE METABOLIC PANEL WITH GFR     Status: Abnormal   Collection Time: 12/08/18  7:35 AM  Result Value Ref Range   Glucose, Bld 96 65 - 99 mg/dL    Comment: .            Fasting reference interval .    BUN 13 7 - 25 mg/dL   Creat 7.41 6.38 - 4.53 mg/dL   GFR, Est Non African American 104 > OR = 60 mL/min/1.10m2   GFR, Est African American 121 > OR = 60 mL/min/1.80m2   BUN/Creatinine Ratio NOT APPLICABLE 6 - 22 (calc)   Sodium 138 135 - 146 mmol/L   Potassium 4.1 3.5 - 5.3 mmol/L   Chloride 100 98 - 110 mmol/L   CO2 27 20 - 32 mmol/L   Calcium 9.2 8.6 - 10.3 mg/dL   Total Protein 7.0 6.1 - 8.1 g/dL   Albumin 4.7 3.6 - 5.1  g/dL   Globulin 2.3 1.9 - 3.7 g/dL (calc)   AG Ratio 2.0 1.0 - 2.5 (calc)   Total Bilirubin 0.8 0.2 - 1.2 mg/dL   Alkaline phosphatase (APISO) 65 40 - 115 U/L   AST 49 (H) 10 - 40 U/L   ALT 82 (H) 9 - 46 U/L  Lipid Panel w/reflex Direct LDL     Status: Abnormal   Collection Time: 12/08/18  7:35 AM  Result Value Ref Range   Cholesterol 115 <200 mg/dL   HDL 28 (L) >30>40 mg/dL   Triglycerides 865312 (H) <150 mg/dL    Comment: . If a non-fasting specimen was collected, consider repeat triglyceride testing on a fasting specimen if clinically indicated.  Calvin MountJacobson et al. J. of Clin. Lipidol. 2015;9:129-169. Marland Kitchen.    LDL Cholesterol (Calc) 54 mg/dL (calc)    Comment: Reference range: <100 . Desirable range <100 mg/dL for primary prevention;   <70 mg/dL for patients with CHD or diabetic patients  with > or = 2 CHD risk factors. Marland Kitchen. LDL-C is now calculated using the Martin-Hopkins    calculation, which is a validated novel method providing  better accuracy than the Friedewald equation in the  estimation of LDL-C.  Horald PollenMartin SS et al. Lenox AhrJAMA. 7846;962(952013;310(19): 2061-2068  (http://education.[facility].com/faq/FAQ164)    Total CHOL/HDL Ratio 4.1 <5.0 (calc)   Non-HDL Cholesterol (Calc) 87 <284<130 mg/dL (calc)    Comment: For patients with diabetes plus 1 major ASCVD risk  factor, treating to a non-HDL-C goal of <100 mg/dL  (LDL-C of <13<70 mg/dL) is considered a therapeutic  option.   Uric acid     Status: Abnormal   Collection Time: 12/08/18  7:35 AM  Result Value Ref Range   Uric Acid, Serum 8.3 (H) 4.0 - 8.0 mg/dL    Comment: Therapeutic target for gout patients: <6.0 mg/dL .    No results found.    Assessment and Plan: 33 y.o. male with  Chest pain: Etiology unclear.  Cardiac etiology less likely as symptoms are nonexertional and EKG is normal.  Patient could be experiencing some mild myalgia from statins or this could be more simple costochondritis.  Plan to adjust statins as noted below and watchful waiting.  Recheck with myself or cardiology in about about 6 weeks.  If pain is still persistent and not worsening may consider further work-up.  Lipid management: Triglycerides significantly improved on Vascepa.  Plan to continue Vascepa.  LDL is well controlled at 54.  As noted above patient is consuming grapefruit juice and having some muscle soreness.  I think this may be interaction with high-dose atorvastatin.  I would like to switch to moderate dose rosuvastatin as this should not interact less with grapefruit juice and perhaps cause less potential myalgia.  Plan to switch to 20 mg of rosuvastatin.  Patient has scheduled checkup with cardiology in about 6 weeks.  Plan to get labs prior to that visit.  Gout: Uric acid is still elevated and worsened a bit.  Patient is adherent to his allopurinol however he had reduced dietary adherence over the holidays.  Plan to  increase allopurinol to 450 mg daily to achieve goal of uric acid less than 6.  Recheck uric acid with rest of fasting labs in about 5 weeks.  Mild subtle persistent transaminitis.  May be due to statin as well.  Switch to rosuvastatin of and recheck in 5 weeks or so    PDMP not reviewed this encounter. Orders Placed This Encounter  Procedures  .  EKG    Standing Status:   Future    Standing Expiration Date:   12/09/2019   No orders of the defined types were placed in this encounter.    Historical information moved to improve visibility of documentation.  Past Medical History:  Diagnosis Date  . Chronic gout 03/26/2017  . Elevated triglycerides with high cholesterol 10/27/2018  . Genital warts 01/18/2016  . Morbid obesity (HCC) 09/15/2018  . Psoriasis 03/26/2017   No past surgical history on file. Social History   Tobacco Use  . Smoking status: Never Smoker  . Smokeless tobacco: Current User  Substance Use Topics  . Alcohol use: Yes   family history includes Non-Hodgkin's lymphoma in his mother.  Medications: Current Outpatient Medications  Medication Sig Dispense Refill  . allopurinol (ZYLOPRIM) 300 MG tablet Take 1 tablet (300 mg total) by mouth daily. 90 tablet 3  . atorvastatin (LIPITOR) 80 MG tablet Take 1 tablet (80 mg total) by mouth daily. 90 tablet 3  . colchicine 0.6 MG tablet Take 1 tablet (0.6 mg total) by mouth daily. For 2 months than switch to as needed 90 tablet 3  . Icosapent Ethyl 1 g CAPS Take 2 capsules (2 g total) by mouth 2 (two) times daily. 360 capsule 3  . valACYclovir (VALTREX) 500 MG tablet Take 1 tablet (500 mg total) by mouth 2 (two) times daily. 6 tablet 12   No current facility-administered medications for this visit.    No Known Allergies   Discussed warning signs or symptoms. Please see discharge instructions. Patient expresses understanding.

## 2018-12-09 NOTE — Patient Instructions (Addendum)
Thank you for coming in today.  We will switch from Atorvistatin  (lipitor) to Crestor daily.  This is safer with Grapefruit juice.  Keep track of the chest symptoms and let me know how that is doing.    We will plan to recheck labs again before you see Dr Rennis Golden at the end of February.  Keep me updated.

## 2018-12-10 NOTE — Addendum Note (Signed)
Addended by: Mallie Snooks R on: 12/10/2018 03:50 PM   Modules accepted: Orders

## 2019-01-21 ENCOUNTER — Ambulatory Visit: Payer: PRIVATE HEALTH INSURANCE | Admitting: Internal Medicine

## 2019-01-21 ENCOUNTER — Encounter: Payer: Self-pay | Admitting: Internal Medicine

## 2019-01-21 VITALS — BP 130/90 | HR 96 | Wt 230.8 lb

## 2019-01-21 DIAGNOSIS — E781 Pure hyperglyceridemia: Secondary | ICD-10-CM

## 2019-01-21 NOTE — Progress Notes (Signed)
LIPID CLINIC CONSULT NOTE  Chief Complaint:  Hypertriglyceridemia  Primary Care Physician: Rodolph Bong, MD  Primary Cardiologist:  No primary care provider on file.  HPI:  Calvin Rubio is a 33 y.o. male who is being seen today for the evaluation of hypertriglyceridemia at the request of Rodolph Bong, MD.  This is a pleasant 33 year old male who is kindly referred for evaluation and management of hypertriglyceridemia.  Calvin Rubio was recently identified as having very high triglycerides by routine physical exam, but subsequently was noted that his family had significant elevation in triglycerides including his mom and his father.  Clear what side of the family may be contributing to this, however labs in December 2019 showed a total cholesterol 287, HDL 26 and a triglyceride level of 3150.  Subsequently was counseled to make significant dietary changes and is worked on stopping alcohol, decreasing sugars and saturated fats and is managed to lose weight.  In addition he was initially placed on atorvastatin however does like to drink grapefruit juice, noted that he may have had significant myalgias including atypical chest pain related to that.  After switching to rosuvastatin, his symptoms have improved.  He is currently on rosuvastatin 20 mg daily and was started on Vascepa 2 g twice daily.  Both medications are well tolerated and a repeat lipid profile was performed on December 08, 2018, just over 1 month after his most recent labs.  This demonstrated a significant improvement in his numbers, including a total cholesterol of 115, HDL of 28, LDL of 54 and triglycerides of 312.  Again, this is a much earlier lipid profile that we would expect for maximal benefit of the medications.  PMHx:  Past Medical History:  Diagnosis Date  . Chronic gout 03/26/2017  . Elevated triglycerides with high cholesterol 10/27/2018  . Genital warts 01/18/2016  . Morbid obesity (HCC) 09/15/2018  . Psoriasis  03/26/2017    No past surgical history on file.  FAMHx:  Family History  Problem Relation Age of Onset  . Non-Hodgkin's lymphoma Mother   . Heart attack Paternal Grandfather     SOCHx:   reports that he has never smoked. He uses smokeless tobacco. He reports current alcohol use. He reports that he does not use drugs.  ALLERGIES:  No Known Allergies  ROS: Pertinent items noted in HPI and remainder of comprehensive ROS otherwise negative.  HOME MEDS: Current Outpatient Medications on File Prior to Visit  Medication Sig Dispense Refill  . allopurinol (ZYLOPRIM) 300 MG tablet Take 1.5 tablets (450 mg total) by mouth daily. 135 tablet 3  . colchicine 0.6 MG tablet Take 1 tablet (0.6 mg total) by mouth daily. For 2 months than switch to as needed 90 tablet 3  . Icosapent Ethyl 1 g CAPS Take 2 capsules (2 g total) by mouth 2 (two) times daily. 360 capsule 3  . rosuvastatin (CRESTOR) 20 MG tablet Take 1 tablet (20 mg total) by mouth daily. 90 tablet 3  . valACYclovir (VALTREX) 500 MG tablet Take 1 tablet (500 mg total) by mouth 2 (two) times daily. 6 tablet 12   No current facility-administered medications on file prior to visit.     LABS/IMAGING: No results found for this or any previous visit (from the past 48 hour(s)). No results found.  LIPID PANEL:    Component Value Date/Time   CHOL 115 12/08/2018 0735   TRIG 312 (H) 12/08/2018 0735   HDL 28 (L) 12/08/2018 9518  CHOLHDL 4.1 12/08/2018 0735   LDLCALC 54 12/08/2018 0735   LDLDIRECT CANCELED 10/26/2018 0738    WEIGHTS: Wt Readings from Last 3 Encounters:  01/21/19 230 lb 12.8 oz (104.7 kg)  12/09/18 236 lb (107 kg)  10/27/18 238 lb (108 kg)    VITALS: BP 130/90   Pulse 96   Wt 230 lb 12.8 oz (104.7 kg)   HC 68" (172.7 cm)   BMI 35.09 kg/m   EXAM: General appearance: alert, no distress and moderately obese Neck: no carotid bruit, no JVD and thyroid not enlarged, symmetric, no tenderness/mass/nodules Lungs:  clear to auscultation bilaterally Heart: regular rate and rhythm, S1, S2 normal, no murmur, click, rub or gallop Abdomen: soft, non-tender; bowel sounds normal; no masses,  no organomegaly Extremities: extremities normal, atraumatic, no cyanosis or edema Pulses: 2+ and symmetric Skin: Skin color, texture, turgor normal. No rashes or lesions Neurologic: Grossly normal Psych: Pleasant  EKG: Deferred  ASSESSMENT: 1. Friedrickson type V hypertriglyceridemia  PLAN: 1.   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  There is a family history of this as well more likely in his father side.  He has had excellent response to both statin therapy and Vascepa with marked reduction in his lipid profile beyond the medications indicating significant dietary changes and lifestyle modifications were made.  At this point he is well treated on appropriate therapy and with LDL at target less than 70 and triglycerides less than 500, his risk of pancreatitis is low.  He has no known coronary disease and is quite young still to have developed that.  We may need to consider screening for early onset coronary disease in his mid 30s with a calcium score.  I do not see an indication for that now.  Hopefully will continue dietary modifications and these medications long-term.  Thanks for the kind referral.  I think you have done a great job managing him.  He can follow-up with me as needed.  Chrystie Nose, MD, [facility], FACP  [place]  [facility]  Medical Director of the [facility] Lipid [facility] &  Cardiovascular Risk Reduction Clinic Diplomate of the American Board of Clinical Lipidology Attending Cardiologist  Direct Dial: [phone]  Fax: [phone]  Website:  [email or web address] 01/21/2019, 3:56 PM

## 2019-01-21 NOTE — Patient Instructions (Addendum)
Medication Instructions:  Dr.Hilty recommends that you continue on your current medications as directed. Please refer to the Current Medication list given to you today.   Lab work: None ordered  Testing/Procedures: None ordered  Follow-Up: Dr. Rennis Golden recommends that you schedule a follow up visit with him the in the LIPID CLINIC  as needed.

## 2019-12-06 ENCOUNTER — Other Ambulatory Visit: Payer: Self-pay | Admitting: Family Medicine

## 2019-12-09 ENCOUNTER — Other Ambulatory Visit: Payer: Self-pay | Admitting: Family Medicine

## 2019-12-09 NOTE — Telephone Encounter (Signed)
Must make appointment 

## 2019-12-27 ENCOUNTER — Other Ambulatory Visit: Payer: Self-pay | Admitting: Family Medicine

## 2019-12-27 NOTE — Telephone Encounter (Signed)
Needs appt with Dr. Matthews.

## 2019-12-27 NOTE — Telephone Encounter (Signed)
Called patient and will make appointment sent him to scheduling but is gonna make it for March as he started a new job and has to wait until then for insurance. KG LPN

## 2019-12-30 ENCOUNTER — Telehealth: Payer: Self-pay | Admitting: Family Medicine

## 2019-12-30 NOTE — Telephone Encounter (Signed)
Message from plan: Request Reference Number: [id number]. VASCEPA CAP 1GM is approved through 01/23/2020. Pharmacy aware and form sent to scan.

## 2020-02-24 ENCOUNTER — Ambulatory Visit: Payer: PRIVATE HEALTH INSURANCE | Admitting: Family Medicine

## 2020-03-04 ENCOUNTER — Other Ambulatory Visit: Payer: Self-pay | Admitting: Family Medicine

## 2020-03-31 ENCOUNTER — Other Ambulatory Visit: Payer: Self-pay | Admitting: Family Medicine

## 2020-12-01 ENCOUNTER — Telehealth: Payer: Self-pay | Admitting: Neurology

## 2020-12-01 NOTE — Telephone Encounter (Signed)
Prior Authorization for Vascepa submitted via covermymeds. Awaiting response.  [employer] is processing your PA request and will respond shortly with next steps. You may close this dialog, return to your dashboard, and perform other tasks. To check for an update later, open this request again from your dashboard.  If you need assistance, please chat with CoverMyMeds or call us at [phone].
